# Patient Record
Sex: Female | Born: 1945
Health system: Southern US, Community
[De-identification: ages and names within clinical notes are randomized; demographics above are authoritative.]

## PROBLEM LIST (undated history)

## (undated) DIAGNOSIS — Z5189 Encounter for other specified aftercare: Secondary | ICD-10-CM

## (undated) DIAGNOSIS — K219 Gastro-esophageal reflux disease without esophagitis: Secondary | ICD-10-CM

## (undated) DIAGNOSIS — T7840XA Allergy, unspecified, initial encounter: Secondary | ICD-10-CM

## (undated) DIAGNOSIS — F329 Major depressive disorder, single episode, unspecified: Secondary | ICD-10-CM

## (undated) DIAGNOSIS — M5030 Other cervical disc degeneration, unspecified cervical region: Secondary | ICD-10-CM

## (undated) DIAGNOSIS — D509 Iron deficiency anemia, unspecified: Secondary | ICD-10-CM

## (undated) DIAGNOSIS — F32A Depression, unspecified: Secondary | ICD-10-CM

## (undated) DIAGNOSIS — I10 Essential (primary) hypertension: Secondary | ICD-10-CM

## (undated) DIAGNOSIS — R51 Headache: Secondary | ICD-10-CM

## (undated) DIAGNOSIS — N8111 Cystocele, midline: Secondary | ICD-10-CM

## (undated) DIAGNOSIS — F419 Anxiety disorder, unspecified: Secondary | ICD-10-CM

## (undated) DIAGNOSIS — D649 Anemia, unspecified: Secondary | ICD-10-CM

## (undated) DIAGNOSIS — M19011 Primary osteoarthritis, right shoulder: Secondary | ICD-10-CM

## (undated) HISTORY — DX: Allergy, unspecified, initial encounter: T78.40XA

## (undated) HISTORY — DX: Other cervical disc degeneration, unspecified cervical region: M50.30

## (undated) HISTORY — DX: Gastro-esophageal reflux disease without esophagitis: K21.9

## (undated) HISTORY — PX: EYE SURGERY: SHX253

## (undated) HISTORY — DX: Headache: R51

## (undated) HISTORY — DX: Essential (primary) hypertension: I10

## (undated) HISTORY — PX: TUBAL LIGATION: SHX77

## (undated) HISTORY — DX: Encounter for other specified aftercare: Z51.89

## (undated) HISTORY — PX: COLONOSCOPY: SHX174

## (undated) HISTORY — DX: Depression, unspecified: F32.A

## (undated) HISTORY — DX: Anemia, unspecified: D64.9

## (undated) HISTORY — DX: Primary osteoarthritis, right shoulder: M19.011

## (undated) HISTORY — DX: Major depressive disorder, single episode, unspecified: F32.9

## (undated) HISTORY — DX: Iron deficiency anemia, unspecified: D50.9

## (undated) HISTORY — DX: Cystocele, midline: N81.11

## (undated) HISTORY — DX: Anxiety disorder, unspecified: F41.9

---

## 1998-04-29 ENCOUNTER — Other Ambulatory Visit: Admission: RE | Admit: 1998-04-29 | Discharge: 1998-04-29 | Payer: Self-pay | Admitting: Obstetrics & Gynecology

## 1998-10-24 ENCOUNTER — Other Ambulatory Visit: Admission: RE | Admit: 1998-10-24 | Discharge: 1998-10-24 | Payer: Self-pay | Admitting: Family Medicine

## 1999-09-27 ENCOUNTER — Encounter: Admission: RE | Admit: 1999-09-27 | Discharge: 1999-09-27 | Payer: Self-pay | Admitting: Family Medicine

## 1999-09-27 ENCOUNTER — Encounter: Payer: Self-pay | Admitting: Family Medicine

## 1999-10-16 ENCOUNTER — Other Ambulatory Visit: Admission: RE | Admit: 1999-10-16 | Discharge: 1999-10-16 | Payer: Self-pay | Admitting: Family Medicine

## 2000-09-26 ENCOUNTER — Encounter: Payer: Self-pay | Admitting: Family Medicine

## 2000-09-26 ENCOUNTER — Encounter: Admission: RE | Admit: 2000-09-26 | Discharge: 2000-09-26 | Payer: Self-pay | Admitting: Family Medicine

## 2000-10-17 ENCOUNTER — Other Ambulatory Visit: Admission: RE | Admit: 2000-10-17 | Discharge: 2000-10-17 | Payer: Self-pay | Admitting: Family Medicine

## 2001-09-29 ENCOUNTER — Encounter: Payer: Self-pay | Admitting: Family Medicine

## 2001-09-29 ENCOUNTER — Encounter: Admission: RE | Admit: 2001-09-29 | Discharge: 2001-09-29 | Payer: Self-pay | Admitting: Family Medicine

## 2001-11-05 ENCOUNTER — Other Ambulatory Visit: Admission: RE | Admit: 2001-11-05 | Discharge: 2001-11-05 | Payer: Self-pay | Admitting: Family Medicine

## 2002-11-17 ENCOUNTER — Other Ambulatory Visit: Admission: RE | Admit: 2002-11-17 | Discharge: 2002-11-17 | Payer: Self-pay | Admitting: Family Medicine

## 2002-12-07 ENCOUNTER — Encounter: Admission: RE | Admit: 2002-12-07 | Discharge: 2002-12-07 | Payer: Self-pay | Admitting: Family Medicine

## 2002-12-07 ENCOUNTER — Encounter: Payer: Self-pay | Admitting: Family Medicine

## 2003-06-25 ENCOUNTER — Encounter: Payer: Self-pay | Admitting: Family Medicine

## 2003-06-25 ENCOUNTER — Encounter: Admission: RE | Admit: 2003-06-25 | Discharge: 2003-06-25 | Payer: Self-pay | Admitting: Family Medicine

## 2003-11-22 ENCOUNTER — Other Ambulatory Visit: Admission: RE | Admit: 2003-11-22 | Discharge: 2003-11-22 | Payer: Self-pay | Admitting: Family Medicine

## 2003-12-23 ENCOUNTER — Encounter: Admission: RE | Admit: 2003-12-23 | Discharge: 2003-12-23 | Payer: Self-pay | Admitting: Family Medicine

## 2004-03-01 ENCOUNTER — Emergency Department (HOSPITAL_COMMUNITY): Admission: EM | Admit: 2004-03-01 | Discharge: 2004-03-01 | Payer: Self-pay | Admitting: Emergency Medicine

## 2004-03-08 ENCOUNTER — Encounter: Admission: RE | Admit: 2004-03-08 | Discharge: 2004-03-08 | Payer: Self-pay | Admitting: Family Medicine

## 2004-09-11 ENCOUNTER — Ambulatory Visit: Payer: Self-pay | Admitting: Family Medicine

## 2004-12-28 ENCOUNTER — Encounter: Admission: RE | Admit: 2004-12-28 | Discharge: 2004-12-28 | Payer: Self-pay | Admitting: Family Medicine

## 2004-12-28 ENCOUNTER — Ambulatory Visit: Payer: Self-pay | Admitting: Family Medicine

## 2005-01-03 ENCOUNTER — Other Ambulatory Visit: Admission: RE | Admit: 2005-01-03 | Discharge: 2005-01-03 | Payer: Self-pay | Admitting: Family Medicine

## 2005-01-03 ENCOUNTER — Ambulatory Visit: Payer: Self-pay | Admitting: Family Medicine

## 2005-02-01 ENCOUNTER — Ambulatory Visit: Payer: Self-pay | Admitting: Internal Medicine

## 2005-02-19 ENCOUNTER — Ambulatory Visit: Payer: Self-pay | Admitting: Internal Medicine

## 2005-02-19 ENCOUNTER — Encounter: Payer: Self-pay | Admitting: Family Medicine

## 2005-03-07 ENCOUNTER — Ambulatory Visit: Payer: Self-pay | Admitting: Family Medicine

## 2005-12-31 ENCOUNTER — Encounter: Admission: RE | Admit: 2005-12-31 | Discharge: 2005-12-31 | Payer: Self-pay | Admitting: Family Medicine

## 2006-01-01 ENCOUNTER — Ambulatory Visit: Payer: Self-pay | Admitting: Family Medicine

## 2006-01-04 ENCOUNTER — Ambulatory Visit: Payer: Self-pay | Admitting: Family Medicine

## 2006-01-11 ENCOUNTER — Encounter: Payer: Self-pay | Admitting: Family Medicine

## 2006-01-11 ENCOUNTER — Ambulatory Visit: Payer: Self-pay | Admitting: Family Medicine

## 2006-01-11 ENCOUNTER — Other Ambulatory Visit: Admission: RE | Admit: 2006-01-11 | Discharge: 2006-01-11 | Payer: Self-pay | Admitting: Family Medicine

## 2006-06-19 ENCOUNTER — Ambulatory Visit: Payer: Self-pay | Admitting: Family Medicine

## 2006-06-19 LAB — CONVERTED CEMR LAB
Cholesterol: 222 mg/dL (ref 0–200)
HDL: 49.4 mg/dL (ref >39.0)
LDL DIRECT: 143.8 mg/dL
Triglyceride fasting, serum: 51 mg/dL (ref 0–149)
VLDL: 10 mg/dL (ref 0–40)

## 2006-07-29 ENCOUNTER — Ambulatory Visit: Payer: Self-pay | Admitting: Family Medicine

## 2006-08-07 ENCOUNTER — Ambulatory Visit: Payer: Self-pay | Admitting: Family Medicine

## 2006-10-07 ENCOUNTER — Ambulatory Visit: Payer: Self-pay | Admitting: Family Medicine

## 2007-01-08 ENCOUNTER — Encounter: Admission: RE | Admit: 2007-01-08 | Discharge: 2007-01-08 | Payer: Self-pay | Admitting: Family Medicine

## 2007-02-06 ENCOUNTER — Ambulatory Visit: Payer: Self-pay | Admitting: Family Medicine

## 2007-02-06 LAB — CONVERTED CEMR LAB
ALT: 17 units/L (ref 0–40)
AST: 23 units/L (ref 0–37)
Albumin: 3.9 g/dL (ref 3.5–5.2)
BUN: 12 mg/dL (ref 6–23)
Basophils Absolute: 0 10*3/uL (ref 0.0–0.1)
Bilirubin, Direct: 0.1 mg/dL (ref 0.0–0.3)
CO2: 33 meq/L — ABNORMAL HIGH (ref 19–32)
Cholesterol: 216 mg/dL (ref 0–200)
Creatinine, Ser: 0.7 mg/dL (ref 0.4–1.2)
Direct LDL: 129.3 mg/dL
Eosinophils Absolute: 0.2 10*3/uL (ref 0.0–0.6)
Eosinophils Relative: 3.7 % (ref 0.0–5.0)
GFR calc non Af Amer: 91 mL/min
HDL: 51.9 mg/dL (ref >39.0)
MCV: 89.2 fL (ref 78.0–100.0)
Monocytes Absolute: 0.4 10*3/uL (ref 0.2–0.7)
Monocytes Relative: 9.6 % (ref 3.0–11.0)
Neutro Abs: 1.5 10*3/uL (ref 1.4–7.7)
RDW: 11.4 % — ABNORMAL LOW (ref 11.5–14.6)
Sodium: 142 meq/L (ref 135–145)
Total CHOL/HDL Ratio: 4.2
WBC: 4.4 10*3/uL — ABNORMAL LOW (ref 4.5–10.5)

## 2007-02-13 ENCOUNTER — Encounter: Payer: Self-pay | Admitting: Family Medicine

## 2007-02-13 ENCOUNTER — Other Ambulatory Visit: Admission: RE | Admit: 2007-02-13 | Discharge: 2007-02-13 | Payer: Self-pay | Admitting: Family Medicine

## 2007-02-13 ENCOUNTER — Ambulatory Visit: Payer: Self-pay | Admitting: Family Medicine

## 2007-05-14 ENCOUNTER — Telehealth: Payer: Self-pay | Admitting: Family Medicine

## 2007-08-04 ENCOUNTER — Ambulatory Visit: Payer: Self-pay | Admitting: Family Medicine

## 2007-08-09 DIAGNOSIS — L820 Inflamed seborrheic keratosis: Secondary | ICD-10-CM | POA: Insufficient documentation

## 2008-01-07 ENCOUNTER — Telehealth: Payer: Self-pay | Admitting: Family Medicine

## 2008-01-12 ENCOUNTER — Encounter: Admission: RE | Admit: 2008-01-12 | Discharge: 2008-01-12 | Payer: Self-pay | Admitting: Family Medicine

## 2008-01-27 ENCOUNTER — Ambulatory Visit: Payer: Self-pay | Admitting: Family Medicine

## 2008-01-27 DIAGNOSIS — I10 Essential (primary) hypertension: Secondary | ICD-10-CM

## 2008-01-27 DIAGNOSIS — F329 Major depressive disorder, single episode, unspecified: Secondary | ICD-10-CM

## 2008-02-18 ENCOUNTER — Ambulatory Visit: Payer: Self-pay | Admitting: Family Medicine

## 2008-02-18 LAB — CONVERTED CEMR LAB
ALT: 19 units/L (ref 0–35)
AST: 23 units/L (ref 0–37)
Alkaline Phosphatase: 51 units/L (ref 39–117)
Cholesterol: 190 mg/dL (ref 0–200)
Creatinine, Ser: 0.7 mg/dL (ref 0.4–1.2)
Eosinophils Relative: 2.4 % (ref 0.0–5.0)
GFR calc Af Amer: 109 mL/min
GFR calc non Af Amer: 90 mL/min
Glucose, Bld: 90 mg/dL (ref 70–99)
HCT: 36.6 % (ref 36.0–46.0)
Hemoglobin: 12.6 g/dL (ref 12.0–15.0)
Lymphocytes Relative: 36 % (ref 12.0–46.0)
MCHC: 34.4 g/dL (ref 30.0–36.0)
MCV: 90.9 fL (ref 78.0–100.0)
Monocytes Relative: 7.6 % (ref 3.0–12.0)
Neutrophils Relative %: 53.4 % (ref 43.0–77.0)
Platelets: 299 10*3/uL (ref 150–400)
RBC: 4.02 M/uL (ref 3.87–5.11)
RDW: 12.8 % (ref 11.5–14.6)
Total Bilirubin: 0.7 mg/dL (ref 0.3–1.2)
Total CHOL/HDL Ratio: 3.6
Total Protein: 6.6 g/dL (ref 6.0–8.3)
VLDL: 12 mg/dL (ref 0–40)

## 2008-02-27 ENCOUNTER — Encounter: Payer: Self-pay | Admitting: Family Medicine

## 2008-02-27 ENCOUNTER — Ambulatory Visit: Payer: Self-pay | Admitting: Family Medicine

## 2008-02-27 ENCOUNTER — Other Ambulatory Visit: Admission: RE | Admit: 2008-02-27 | Discharge: 2008-02-27 | Payer: Self-pay | Admitting: Family Medicine

## 2008-02-27 LAB — CONVERTED CEMR LAB: Helicobacter Pylori Antibody-IgG: 0.6

## 2008-03-01 ENCOUNTER — Telehealth: Payer: Self-pay | Admitting: Family Medicine

## 2008-03-15 ENCOUNTER — Encounter: Payer: Self-pay | Admitting: Family Medicine

## 2008-03-29 ENCOUNTER — Ambulatory Visit: Payer: Self-pay | Admitting: Family Medicine

## 2008-04-14 ENCOUNTER — Telehealth: Payer: Self-pay | Admitting: Family Medicine

## 2008-09-10 ENCOUNTER — Ambulatory Visit: Payer: Self-pay | Admitting: Family Medicine

## 2008-09-10 DIAGNOSIS — N329 Bladder disorder, unspecified: Secondary | ICD-10-CM | POA: Insufficient documentation

## 2008-09-13 ENCOUNTER — Telehealth: Payer: Self-pay | Admitting: Family Medicine

## 2008-11-15 ENCOUNTER — Telehealth: Payer: Self-pay | Admitting: *Deleted

## 2009-01-26 ENCOUNTER — Encounter: Admission: RE | Admit: 2009-01-26 | Discharge: 2009-01-26 | Payer: Self-pay | Admitting: Family Medicine

## 2009-04-01 ENCOUNTER — Ambulatory Visit: Payer: Self-pay | Admitting: Family Medicine

## 2009-04-01 LAB — CONVERTED CEMR LAB
Alkaline Phosphatase: 46 units/L (ref 39–117)
Basophils Relative: 1.3 % (ref 0.0–3.0)
CO2: 30 meq/L (ref 19–32)
Calcium: 9.1 mg/dL (ref 8.4–10.5)
Cholesterol: 187 mg/dL (ref 0–200)
Eosinophils Absolute: 0.1 10*3/uL (ref 0.0–0.7)
Glucose, Urine, Semiquant: NEGATIVE
Ketones, urine, test strip: NEGATIVE
MCV: 91 fL (ref 78.0–100.0)
Monocytes Absolute: 0.4 10*3/uL (ref 0.1–1.0)
Monocytes Relative: 9 % (ref 3.0–12.0)
Neutro Abs: 2 10*3/uL (ref 1.4–7.7)
Neutrophils Relative %: 47.2 % (ref 43.0–77.0)
Nitrite: NEGATIVE
Potassium: 3.9 meq/L (ref 3.5–5.1)
RDW: 12.4 % (ref 11.5–14.6)
Sodium: 144 meq/L (ref 135–145)
Specific Gravity, Urine: 1.015
Total CHOL/HDL Ratio: 4
Total Protein: 6.5 g/dL (ref 6.0–8.3)
Triglycerides: 71 mg/dL (ref 0.0–149.0)
VLDL: 14.2 mg/dL (ref 0.0–40.0)
WBC: 4.3 10*3/uL — ABNORMAL LOW (ref 4.5–10.5)
pH: 7.5

## 2009-04-11 ENCOUNTER — Other Ambulatory Visit: Admission: RE | Admit: 2009-04-11 | Discharge: 2009-04-11 | Payer: Self-pay | Admitting: Family Medicine

## 2009-04-11 ENCOUNTER — Encounter: Payer: Self-pay | Admitting: Family Medicine

## 2009-04-11 ENCOUNTER — Ambulatory Visit: Payer: Self-pay | Admitting: Family Medicine

## 2009-09-07 ENCOUNTER — Telehealth: Payer: Self-pay | Admitting: Family Medicine

## 2009-09-15 ENCOUNTER — Encounter (INDEPENDENT_AMBULATORY_CARE_PROVIDER_SITE_OTHER): Payer: Self-pay | Admitting: *Deleted

## 2009-10-14 ENCOUNTER — Telehealth: Payer: Self-pay | Admitting: Family Medicine

## 2010-02-08 ENCOUNTER — Encounter: Admission: RE | Admit: 2010-02-08 | Discharge: 2010-02-08 | Payer: Self-pay | Admitting: Family Medicine

## 2010-02-20 ENCOUNTER — Ambulatory Visit: Payer: Self-pay | Admitting: Family Medicine

## 2010-02-20 LAB — CONVERTED CEMR LAB
AST: 27 units/L (ref 0–37)
Alkaline Phosphatase: 57 units/L (ref 39–117)
BUN: 18 mg/dL (ref 6–23)
Basophils Absolute: 0 10*3/uL (ref 0.0–0.1)
Bilirubin, Direct: 0.1 mg/dL (ref 0.0–0.3)
CO2: 30 meq/L (ref 19–32)
Calcium: 9.3 mg/dL (ref 8.4–10.5)
Chloride: 104 meq/L (ref 96–112)
Eosinophils Relative: 2.2 % (ref 0.0–5.0)
Glucose, Bld: 90 mg/dL (ref 70–99)
Iron: 55 ug/dL (ref 42–145)
Lymphocytes Relative: 39.7 % (ref 12.0–46.0)
Lymphs Abs: 2.3 10*3/uL (ref 0.7–4.0)
MCHC: 34.4 g/dL (ref 30.0–36.0)
Neutro Abs: 2.9 10*3/uL (ref 1.4–7.7)
Potassium: 4 meq/L (ref 3.5–5.1)
RBC: 3.95 M/uL (ref 3.87–5.11)
TSH: 1.66 microintl units/mL (ref 0.35–5.50)
Total Bilirubin: 0.3 mg/dL (ref 0.3–1.2)
Total Protein: 6.9 g/dL (ref 6.0–8.3)
Vitamin B-12: 537 pg/mL (ref 211–911)
WBC: 5.9 10*3/uL (ref 4.5–10.5)

## 2010-06-05 ENCOUNTER — Telehealth: Payer: Self-pay | Admitting: Family Medicine

## 2010-06-05 ENCOUNTER — Ambulatory Visit: Payer: Self-pay | Admitting: Family Medicine

## 2010-06-05 LAB — CONVERTED CEMR LAB
Albumin: 4.2 g/dL (ref 3.5–5.2)
Bilirubin Urine: NEGATIVE
CO2: 30 meq/L (ref 19–32)
Calcium: 9 mg/dL (ref 8.4–10.5)
Creatinine, Ser: 0.7 mg/dL (ref 0.4–1.2)
Direct LDL: 169.7 mg/dL
Eosinophils Relative: 2 % (ref 0.0–5.0)
HDL: 59.3 mg/dL (ref >39.00)
Hemoglobin: 12.8 g/dL (ref 12.0–15.0)
Ketones, urine, test strip: NEGATIVE
MCHC: 34.4 g/dL (ref 30.0–36.0)
Monocytes Relative: 8.3 % (ref 3.0–12.0)
Neutro Abs: 2.3 10*3/uL (ref 1.4–7.7)
Nitrite: NEGATIVE
Potassium: 3.3 meq/L — ABNORMAL LOW (ref 3.5–5.1)
Protein, U semiquant: NEGATIVE
RBC: 4.02 M/uL (ref 3.87–5.11)
RDW: 12.8 % (ref 11.5–14.6)
TSH: 1.69 microintl units/mL (ref 0.35–5.50)
Triglycerides: 93 mg/dL (ref 0.0–149.0)
Urobilinogen, UA: 0.2

## 2010-06-12 ENCOUNTER — Encounter: Payer: Self-pay | Admitting: Family Medicine

## 2010-06-12 ENCOUNTER — Other Ambulatory Visit: Admission: RE | Admit: 2010-06-12 | Discharge: 2010-06-12 | Payer: Self-pay | Admitting: Family Medicine

## 2010-06-12 ENCOUNTER — Ambulatory Visit: Payer: Self-pay | Admitting: Family Medicine

## 2010-06-12 LAB — CONVERTED CEMR LAB: Pap Smear: NEGATIVE

## 2010-10-05 NOTE — Assessment & Plan Note (Signed)
Summary: FATIGUE // RS   Vital Signs:  Patient profile:   65 year old female Menstrual status:  postmenopausal Weight:      131 pounds BMI:     25.25 Temp:     97.9 degrees F oral BP sitting:   130 / 84  (left arm) Cuff size:   regular  Vitals Entered By: Kern Reap CMA Duncan Dull) (February 20, 2010 3:55 PM)  CC: no energy in legs   CC:  no energy in legs.  History of Present Illness: Sally Shea is a 65 year old, married female, nonsmoker, who comes in today for evaluation of generalized fatigue, and bilateral lower extremity weakness.  Two weeks ago she began feeling this way.  She's otherwise been well.  No fever, chills, earache, sore throat, cough, nausea, vomiting, etc., etc., etc.  Medications unchanged.  He takes Lipitor thiazide 25 mg daily for swollen legs along with a potassium supplement.  She alternates between 50 and 25 mg of Zoloft daily.  She takes Zomig p.r.n. for migraines, but has not had to take them recently.  No new medication.  Review of systems otherwise negative.  Social history unchanged  Allergies: 1)  Sulfamethoxazole (Sulfamethoxazole)  Past History:  Past medical, surgical, family and social histories (including risk factors) reviewed, and no changes noted (except as noted below).  Past Medical History: Reviewed history from 01/27/2008 and no changes required. Anxiety Depression Headache Hyperlipidemia Hypertension childbirth x 2 Lasix eye surgery cervical degenerative disease  Family History: Reviewed history from 01/27/2008 and no changes required. Family History of Alcoholism/Addiction Family History of Anxiety Family History Depression Family History High cholesterol Family History Hypertension  Social History: Reviewed history from 01/27/2008 and no changes required. Occupation: Married Never Smoked Alcohol use-no Drug use-no Regular exercise-yes  Review of Systems      See HPI  Physical Exam  General:   Well-developed,well-nourished,in no acute distress; alert,appropriate and cooperative throughout examination Msk:  No deformity or scoliosis noted of thoracic or lumbar spine.   Pulses:  R and L carotid,radial,femoral,dorsalis pedis and posterior tibial pulses are full and equal bilaterally Extremities:  No clubbing, cyanosis, edema, or deformity noted with normal full range of motion of all joints.   Neurologic:  No cranial nerve deficits noted. Station and gait are normal. Plantar reflexes are down-going bilaterally. DTRs are symmetrical throughout. Sensory, motor and coordinative functions appear intact. Psych:  Cognition and judgment appear intact. Alert and cooperative with normal attention span and concentration. No apparent delusions, illusions, hallucinations   Problems:  Medical Problems Added: 1)  Dx of Fatigue  (ICD-780.79)  Impression & Recommendations:  Problem # 1:  FATIGUE (ICD-780.79) Assessment New  Orders: Venipuncture ([address]) TLB-BMP (Basic Metabolic Panel-BMET) (80048-METABOL) TLB-CBC Platelet - w/Differential (85025-CBCD) TLB-Hepatic/Liver Function Pnl (80076-HEPATIC) TLB-TSH (Thyroid Stimulating Hormone) (84443-TSH) TLB-B12 + Folate Pnl (60454_09811-B14/NWG) TLB-IBC Pnl (Iron/FE;Transferrin) (83550-IBC) TLB-T3, Free (Triiodothyronine) (84481-T3FREE) TLB-T4 (Thyrox), Free [phone]) TLB-Sedimentation Rate (ESR) (85652-ESR)  Complete Medication List: 1)  Hydrochlorothiazide 25 Mg Tabs (Hydrochlorothiazide) .Marland Kitchen.. 1 tablet by mouth every morning 2)  Zetia 10 Mg Tabs (Ezetimibe) .... Take 1 tablet by mouth once a day 3)  Zoloft 50 Mg Tabs (Sertraline hcl) .... Take 1 tablet by mouth once a day 4)  Zomig Zmt 5 Mg Tbdp (Zolmitriptan) .... Take 1 tablet by mouth 5)  Adult Aspirin Low Strength 81 Mg Tbdp (Aspirin) .... Once daily 6)  Calcium 600 1500 Mg Tabs (Calcium carbonate) .... Once daily 7)  Omega-3 350 Mg Caps (Omega-3 fatty acids) .Marland KitchenMarland KitchenMarland Kitchen  Once daily 8)   Klor-con M20 20 Meq Cr-tabs (Potassium chloride crys cr) .... One by mouth daily 9)  Premarin 0.625 Mg/gm Crea (Estrogens, conjugated) .... Apply weekly  Patient Instructions: 1)  increase the Zoloft to 50 mg daily.  I will call u when I get your lab work back.

## 2010-10-05 NOTE — Miscellaneous (Signed)
  Clinical Lists Changes  Observations: Added new observation of FLU VAX VIS: 04/12/09 version (09/15/2009 12:33) Added new observation of FLU VAXLOT: [id number] (09/15/2009 12:33) Added new observation of FLU VAXMFR: Glaxosmithkline (09/15/2009 12:33) Added new observation of FLU VAX EXP: 03/02/2010 (09/15/2009 12:33) Added new observation of FLU VAX DSE: 0.22ml (09/15/2009 12:33) Added new observation of FLU VAX: Fluvax 3+ (09/15/2009 12:33)Flu Vaccine Consent Questions     Do you have a history of severe allergic reactions to this vaccine? no    Any prior history of allergic reactions to egg and/or gelatin? no    Do you have a sensitivity to the preservative Thimersol? no    Do you have a past history of Guillan-Barre Syndrome? no    Do you currently have an acute febrile illness? no    Have you ever had a severe reaction to latex? no    Vaccine information given and explained to patient? yes    Are you currently pregnant? no    Lot Number:[id number]   Exp Date:03/02/2010   Site Given  Right Deltoid IMvation of FLU VAXMFR: Glaxosmithkline (09/15/2009 12:33) Added new observation of FLU VAX EXP: 03/02/2010 (09/15/2009 12:33) Added new observation of FLU VAX DSE: 0.6ml (09/15/2009 12:33) Added new observation of FLU VAX: Fluvax 3+ (09/15/2009 12:33)    .lbflu

## 2010-10-05 NOTE — Assessment & Plan Note (Signed)
Summary: CPX // RS   Vital Signs:  Patient profile:   65 year old female Menstrual status:  postmenopausal Height:      60.5 inches Weight:      129 pounds BMI:     24.87 O2 Sat:      94 % Temp:     98.2 degrees F oral Pulse rate:   93 / minute Resp:     12 per minute BP sitting:   126 / 74  Vitals Entered By: Lynann Beaver CMA (June 12, 2010 2:46 PM) CC: cpx Is Patient Diabetic? No Pain Assessment Patient in pain? no        CC:  cpx.  History of Present Illness: Sally Shea is a 65 year old, married female, nonsmoker, who comes in today for evaluation of hypertension, hyperlipidemia, mild depression postmenopausal vaginal dryness.  For hypertension.  She takes hydrochlorothiazide 25 mg q.a.m., BP 124/74.  For mild depression.  She takes Zoloft 50 mg nightly feels good.  She also uses Premarin vaginal cream once weekly for vaginal dryness and one, potassium supplement, and aspirin tablet, calcium, vitamin D.  She stopped her CEA because she just didn't want to take anymore.  Lipids have gone up fairly dramatically, however, her cardiac risk factor is a 4.  She gets routine eye care, dental care, BSE monthly, annual mammography, colonoscopy, normal, tetanus, 2003, seasonal flu.  2011  She is not had to take a Zomig.  This year.  The migraine headaches have virtually stopped.  Current Medications (verified): 1)  Hydrochlorothiazide 25 Mg Tabs (Hydrochlorothiazide) .Marland Kitchen.. 1 Tablet By Mouth Every Morning 2)  Zoloft 50 Mg Tabs (Sertraline Hcl) .... Take 1 Tablet By Mouth Once A Day 3)  Zomig Zmt 5 Mg Tbdp (Zolmitriptan) .... Take 1 Tablet By Mouth 4)  Adult Aspirin Low Strength 81 Mg  Tbdp (Aspirin) .... Once Daily 5)  Calcium 600 1500 Mg  Tabs (Calcium Carbonate) .... Once Daily 6)  Omega-3 350 Mg  Caps (Omega-3 Fatty Acids) .... Once Daily 7)  Klor-Con M20 20 Meq  Cr-Tabs (Potassium Chloride Crys Cr) .... One By Mouth Daily 8)  Premarin 0.625 Mg/gm Crea (Estrogens, Conjugated)  .... Apply Weekly  Allergies (verified): 1)  Sulfamethoxazole (Sulfamethoxazole)  Past History:  Past medical, surgical, family and social histories (including risk factors) reviewed, and no changes noted (except as noted below).  Past Medical History: Reviewed history from 01/27/2008 and no changes required. Anxiety Depression Headache Hyperlipidemia Hypertension childbirth x 2 Lasix eye surgery cervical degenerative disease  Family History: Reviewed history from 01/27/2008 and no changes required. Family History of Alcoholism/Addiction Family History of Anxiety Family History Depression Family History High cholesterol Family History Hypertension  Social History: Reviewed history from 01/27/2008 and no changes required. Occupation: Married Never Smoked Alcohol use-no Drug use-no Regular exercise-yes  Review of Systems      See HPI  Physical Exam  General:  Well-developed,well-nourished,in no acute distress; alert,appropriate and cooperative throughout examination Head:  Normocephalic and atraumatic without obvious abnormalities. No apparent alopecia or balding. Eyes:  No corneal or conjunctival inflammation noted. EOMI. Perrla. Funduscopic exam benign, without hemorrhages, exudates or papilledema. Vision grossly normal. Ears:  External ear exam shows no significant lesions or deformities.  Otoscopic examination reveals clear canals, tympanic membranes are intact bilaterally without bulging, retraction, inflammation or discharge. Hearing is grossly normal bilaterally. Nose:  External nasal examination shows no deformity or inflammation. Nasal mucosa are pink and moist without lesions or exudates. Mouth:  Oral mucosa and oropharynx  without lesions or exudates.  Teeth in good repair. Neck:  No deformities, masses, or tenderness noted. Chest Wall:  No deformities, masses, or tenderness noted. Breasts:  No mass, nodules, thickening, tenderness, bulging, retraction,  inflamation, nipple discharge or skin changes noted.   Lungs:  Normal respiratory effort, chest expands symmetrically. Lungs are clear to auscultation, no crackles or wheezes. Heart:  Normal rate and regular rhythm. S1 and S2 normal without gallop, murmur, click, rub or other extra sounds. Abdomen:  Bowel sounds positive,abdomen soft and non-tender without masses, organomegaly or hernias noted. Rectal:  No external abnormalities noted. Normal sphincter tone. No rectal masses or tenderness. Genitalia:  Pelvic Exam:        External: normal female genitalia without lesions or masses        Vagina: normal without lesions or masses        Cervix: normal without lesions or masses        Adnexa: normal bimanual exam without masses or fullness        Uterus: normal by palpation        Pap smear: performed Msk:  No deformity or scoliosis noted of thoracic or lumbar spine.   Pulses:  R and L carotid,radial,femoral,dorsalis pedis and posterior tibial pulses are full and equal bilaterally Extremities:  No clubbing, cyanosis, edema, or deformity noted with normal full range of motion of all joints.   Neurologic:  No cranial nerve deficits noted. Station and gait are normal. Plantar reflexes are down-going bilaterally. DTRs are symmetrical throughout. Sensory, motor and coordinative functions appear intact. Skin:  Intact without suspicious lesions or rashes Cervical Nodes:  No lymphadenopathy noted Axillary Nodes:  No palpable lymphadenopathy Inguinal Nodes:  No significant adenopathy Psych:  Cognition and judgment appear intact. Alert and cooperative with normal attention span and concentration. No apparent delusions, illusions, hallucinations   Impression & Recommendations:  Problem # 1:  HYPERTENSION (ICD-401.9) Assessment Improved  Her updated medication list for this problem includes:    Hydrochlorothiazide 25 Mg Tabs (Hydrochlorothiazide) .Marland Kitchen... 1 tablet by mouth every morning  Orders: EKG w/  Interpretation (93000) Prescription Created Electronically [phone])  Problem # 2:  HEADACHE (ICD-784.0) Assessment: Improved  Her updated medication list for this problem includes:    Zomig Zmt 5 Mg Tbdp (Zolmitriptan) .Marland Kitchen... Take 1 tablet by mouth    Adult Aspirin Low Strength 81 Mg Tbdp (Aspirin) ..... Once daily  Orders: Prescription Created Electronically [phone])  Problem # 3:  HYPERLIPIDEMIA (ICD-272.4) Assessment: Deteriorated  The following medications were removed from the medication list:    Zetia 10 Mg Tabs (Ezetimibe) .Marland Kitchen... Take 1 tablet by mouth once a day  Orders: EKG w/ Interpretation (93000) Prescription Created Electronically [phone])  Problem # 4:  Preventive Health Care (ICD-V70.0) Assessment: Unchanged  Complete Medication List: 1)  Hydrochlorothiazide 25 Mg Tabs (Hydrochlorothiazide) .Marland Kitchen.. 1 tablet by mouth every morning 2)  Zoloft 50 Mg Tabs (Sertraline hcl) .... Take 1 tablet by mouth once a day 3)  Zomig Zmt 5 Mg Tbdp (Zolmitriptan) .... Take 1 tablet by mouth 4)  Adult Aspirin Low Strength 81 Mg Tbdp (Aspirin) .... Once daily 5)  Calcium 600 1500 Mg Tabs (Calcium carbonate) .... Once daily 6)  Omega-3 350 Mg Caps (Omega-3 fatty acids) .... Once daily 7)  Klor-con M20 20 Meq Cr-tabs (Potassium chloride crys cr) .... One by mouth daily 8)  Premarin 0.625 Mg/gm Crea (Estrogens, conjugated) .... Apply weekly  Other Orders: Admin 1st Vaccine ([address]) Flu Vaccine 7yrs + [phone])  Flu Vaccine Consent Questions     Do you have a history of severe allergic reactions to this vaccine? no    Any prior history of allergic reactions to egg and/or gelatin? no    Do you have a sensitivity to the preservative Thimersol? no    Do you have a past history of Guillan-Barre Syndrome? no    Do you currently have an acute febrile illness? no    Have you ever had a severe reaction to latex? no    Vaccine information given and explained to patient? yes    Are you currently  pregnant? no    Lot Number:[id number]   Exp Date:03/03/2011   Site Given  Left Deltoid IM Admin 1st Vaccine ([address]109) Flu Vaccine 74yrs + (60454)  Patient Instructions: 1)  It is important that you exercise regularly at least 20 minutes 5 times a week. If you develop chest pain, have severe difficulty breathing, or feel very tired , stop exercising immediately and seek medical attention. 2)  Schedule your mammogram. 3)  Schedule a colonoscopy/sigmoidoscopy to help detect colon cancer. 4)  If you are having sex and you or your partner don't want a child, use contraception. 5)  Take an Aspirin every day.  81 mg 3 x week 6)  use the Premarin vaginal cream, small amounts twice weekly Prescriptions: PREMARIN 0.625 MG/GM CREA (ESTROGENS, CONJUGATED) apply weekly  #3 tubes x 4   Entered and Authorized by:   Roderick Pee MD   Signed by:   Roderick Pee MD on 06/12/2010   Method used:   Electronically to        [facility] [address] [phone]* (retail)       [address]       [place], [place]  [address]       Ph: [phone] or [phone]       Fax: [phone]   RxID:   [id number] KLOR-CON M20 20 MEQ  CR-TABS (POTASSIUM CHLORIDE CRYS CR) one by mouth daily  #100 Tablet x 3   Entered and Authorized by:   Roderick Pee MD   Signed by:   Roderick Pee MD on 06/12/2010   Method used:   [facility]ronically to        [facility] [address] [phone]* (retail)       [address]       [place], [place]  [address]       Ph: [phone] or [phone]       Fax: [phone]   RxID:   [id number] ZOLOFT 50 MG TABS (SERTRALINE HCL) Take 1 tablet by mouth once a day  #100 Tablet x 3   Entered and Authorized by:   Roderick Pee MD   Signed by:   Roderick Pee MD on 06/12/2010   Method used:   [facility]        [facility] [phone]* (retail)       [address]       [place], [place]  [address]       Ph: [phone] or [phone]       Fax: [phone]   RxID:    [id number] HYDROCHLOROTHIAZIDE 25 MG TABS (HYDROCHLOROTHIAZIDE) 1 tablet by mouth every morning  #100 Tablet x 3   Entered and Authorized by:   Roderick Pee MD   Signed by:   Roderick Pee MD on 06/12/2010   Method used:   [facility]ronically  to        [facility] [phone]* (retail)       [address]       [place], [place]  [address]       Ph: [phone] or [phone]       Fax: [phone]   RxID:   [id number]  .lbflu1

## 2010-10-05 NOTE — Progress Notes (Signed)
Summary: refill  Phone Note Refill Request Call back at Home Phone [phone] Message from:  Patient---walk in  Refills Requested: Medication #1:  KLOR-CON M20 20 MEQ  CR-TABS one by mouth daily [facility]  Initial call taken by: Warnell Forester,  June 05, 2010 9:42 AM    Prescriptions: KLOR-CON M20 20 MEQ  CR-TABS (POTASSIUM CHLORIDE CRYS CR) one by mouth daily  #100 Tablet x 3   Entered by:   Kern Reap CMA (AAMA)   Authorized by:   Roderick Pee MD   Signed by:   Kern Reap CMA (AAMA) on 06/05/2010   Method used:   Electronically to        [facility] [phone]* (retail)       [address]       [place], [place]  [address]       Ph: [phone] or [phone]       Fax: [phone]   RxID:   [id number]

## 2010-10-05 NOTE — Progress Notes (Signed)
Summary: hydromet rx  Phone Note Call from Patient   Summary of Call: rx for cough  Follow-up for Phone Call        Rx Called In Follow-up by: Kern Reap CMA Duncan Dull),  October 14, 2009 8:40 AM

## 2010-10-05 NOTE — Progress Notes (Signed)
Summary: zoloft refill  Phone Note Refill Request Message from:  Pharmacy on September 07, 2009 9:54 AM  Refills Requested: Medication #1:  ZOLOFT 50 MG TABS Take 1 tablet by mouth once a day  Method Requested: Electronic Initial call taken by: Kern Reap CMA Duncan Dull),  September 07, 2009 9:54 AM    Prescriptions: ZOLOFT 50 MG TABS (SERTRALINE HCL) Take 1 tablet by mouth once a day  #100 Tablet x 3   Entered by:   Kern Reap CMA (AAMA)   Authorized by:   Roderick Pee MD   Signed by:   Kern Reap CMA (AAMA) on 09/07/2009   Method used:   [facility]        [facility] [address] [phone]* (retail)       [address]       [place], [place]  [address]       Ph: [phone] or [phone]       Fax: [phone]   RxID:   [id number]

## 2011-04-02 ENCOUNTER — Other Ambulatory Visit: Payer: Self-pay | Admitting: Family Medicine

## 2011-04-02 DIAGNOSIS — Z1231 Encounter for screening mammogram for malignant neoplasm of breast: Secondary | ICD-10-CM

## 2011-04-06 ENCOUNTER — Ambulatory Visit
Admission: RE | Admit: 2011-04-06 | Discharge: 2011-04-06 | Disposition: A | Discharge status: Home / Self Care | Payer: BC Managed Care – PPO | Source: Ambulatory Visit | Attending: Family Medicine | Admitting: Family Medicine

## 2011-04-06 DIAGNOSIS — Z1231 Encounter for screening mammogram for malignant neoplasm of breast: Secondary | ICD-10-CM

## 2011-06-13 ENCOUNTER — Other Ambulatory Visit (INDEPENDENT_AMBULATORY_CARE_PROVIDER_SITE_OTHER): Payer: BC Managed Care – PPO

## 2011-06-13 DIAGNOSIS — Z0000 Encounter for general adult medical examination without abnormal findings: Secondary | ICD-10-CM

## 2011-06-13 LAB — CBC WITH DIFFERENTIAL/PLATELET
Basophils Relative: 0.8 % (ref 0.0–3.0)
HCT: 38.6 % (ref 36.0–46.0)
MCHC: 33.4 g/dL (ref 30.0–36.0)
Monocytes Absolute: 0.4 10*3/uL (ref 0.1–1.0)
Neutrophils Relative %: 44.9 % (ref 43.0–77.0)
RBC: 4.21 Mil/uL (ref 3.87–5.11)

## 2011-06-13 LAB — TSH: TSH: 1.37 u[IU]/mL (ref 0.35–5.50)

## 2011-06-13 LAB — POCT URINALYSIS DIPSTICK
Bilirubin, UA: NEGATIVE
Glucose, UA: NEGATIVE
Ketones, UA: NEGATIVE
Nitrite, UA: NEGATIVE
pH, UA: 7.5

## 2011-06-13 LAB — BASIC METABOLIC PANEL
BUN: 13 mg/dL (ref 6–23)
CO2: 30 mEq/L (ref 19–32)
Calcium: 9.1 mg/dL (ref 8.4–10.5)
Chloride: 103 mEq/L (ref 96–112)
Creatinine, Ser: 0.7 mg/dL (ref 0.4–1.2)
Glucose, Bld: 81 mg/dL (ref 70–99)
Potassium: 3.7 mEq/L (ref 3.5–5.1)

## 2011-06-13 LAB — LIPID PANEL
Total CHOL/HDL Ratio: 5
VLDL: 25.4 mg/dL (ref 0.0–40.0)

## 2011-06-13 LAB — LDL CHOLESTEROL, DIRECT: Direct LDL: 176 mg/dL

## 2011-06-13 LAB — HEPATIC FUNCTION PANEL
Bilirubin, Direct: 0 mg/dL (ref 0.0–0.3)
Total Bilirubin: 0.6 mg/dL (ref 0.3–1.2)

## 2011-06-20 ENCOUNTER — Other Ambulatory Visit (HOSPITAL_COMMUNITY)
Admission: RE | Admit: 2011-06-20 | Discharge: 2011-06-20 | Disposition: A | Discharge status: Home / Self Care | Payer: BC Managed Care – PPO | Source: Ambulatory Visit | Attending: Family Medicine | Admitting: Family Medicine

## 2011-06-20 ENCOUNTER — Encounter: Payer: Self-pay | Admitting: Family Medicine

## 2011-06-20 ENCOUNTER — Ambulatory Visit (INDEPENDENT_AMBULATORY_CARE_PROVIDER_SITE_OTHER): Payer: BC Managed Care – PPO | Admitting: Family Medicine

## 2011-06-20 DIAGNOSIS — N952 Postmenopausal atrophic vaginitis: Secondary | ICD-10-CM

## 2011-06-20 DIAGNOSIS — F329 Major depressive disorder, single episode, unspecified: Secondary | ICD-10-CM

## 2011-06-20 DIAGNOSIS — Z01419 Encounter for gynecological examination (general) (routine) without abnormal findings: Secondary | ICD-10-CM

## 2011-06-20 DIAGNOSIS — I10 Essential (primary) hypertension: Secondary | ICD-10-CM

## 2011-06-20 DIAGNOSIS — Z23 Encounter for immunization: Secondary | ICD-10-CM

## 2011-06-20 MED ORDER — ESTROGENS, CONJUGATED 0.625 MG/GM VA CREA: 1.0000 g | TOPICAL_CREAM | Freq: Every day | VAGINAL | Status: DC

## 2011-06-20 MED ORDER — SERTRALINE HCL 50 MG PO TABS: 50.0000 mg | ORAL_TABLET | Freq: Every day | ORAL | Status: DC

## 2011-06-20 MED ORDER — POTASSIUM CHLORIDE CRYS ER 20 MEQ PO TBCR: 20.0000 meq | EXTENDED_RELEASE_TABLET | Freq: Every day | ORAL | Status: DC

## 2011-06-20 MED ORDER — HYDROCHLOROTHIAZIDE 25 MG PO TABS: 25.0000 mg | ORAL_TABLET | Freq: Every day | ORAL | Status: DC

## 2011-06-20 NOTE — Progress Notes (Signed)
  Subjective:    Patient ID: Sally Shea, female    DOB: [birth year], 65 y.o.   MRN: [id number]  Hypertension   Sally Shea is a 65 year old, married female, nonsmoker, who comes in today for general physical examination because of a history of hypertension, mild depression, migraine headaches, and postmenopausal vaginal dryness.  Her hypertension is treated with Mevacor, thiazide, and one potassium supplement daily.  BP 124/78.  She uses Premarin vaginal cream twice weekly for vaginal dryness.  She takes Zoloft 50 mg nightly for history of mild depression,  She's not had a migraine headache in over a year.  She does routine eye care, dental care, BSE monthly, and mammography, colonoscopy, normal, tetanus, 2003, shingles 2009, seasonal flu shot today   Review of Systems  Constitutional: Negative.   HENT: Negative.   Eyes: Negative.   Respiratory: Negative.   Cardiovascular: Negative.   Gastrointestinal: Negative.   Genitourinary: Negative.   Musculoskeletal: Negative.   Neurological: Negative.   Hematological: Negative.   Psychiatric/Behavioral: Negative.        Objective:   Physical Exam  Constitutional: She appears well-developed and well-nourished.  HENT:  Head: Normocephalic and atraumatic.  Right Ear: External ear normal.  Left Ear: External ear normal.  Nose: Nose normal.  Mouth/Throat: Oropharynx is clear and moist.  Eyes: EOM are normal. Pupils are equal, round, and reactive to light.  Neck: Normal range of motion. Neck supple. No thyromegaly present.  Cardiovascular: Normal rate, regular rhythm, normal heart sounds and intact distal pulses.  Exam reveals no gallop and no friction rub.   No murmur heard. Pulmonary/Chest: Effort normal and breath sounds normal.  Abdominal: Soft. Bowel sounds are normal. She exhibits no distension and no mass. There is no tenderness. There is no rebound.  Genitourinary: Vagina normal and uterus normal. Guaiac negative stool. No vaginal  discharge found.       Bilateral breast exam normal  Musculoskeletal: Normal range of motion.  Lymphadenopathy:    She has no cervical adenopathy.  Neurological: She is alert. She has normal reflexes. No cranial nerve deficit. She exhibits normal muscle tone. Coordination normal.  Skin: Skin is warm and dry.  Psychiatric: She has a normal mood and affect. Her behavior is normal. Judgment and thought content normal.          Assessment & Plan:  Healthy female.  Postmenopausal vaginal dryness.  Continue Premarin cream.  Hypertension.  Continue her chlorothiazide, and one potassium supplement.  Migraine headaches, resolved.  Return in one year, sooner for any problems

## 2011-06-20 NOTE — Patient Instructions (Signed)
Continue your current good health habits.  Return in one year or sooner if any problems

## 2011-06-28 ENCOUNTER — Other Ambulatory Visit: Payer: Self-pay | Admitting: Family Medicine

## 2011-06-28 DIAGNOSIS — R29890 Loss of height: Secondary | ICD-10-CM

## 2011-06-28 DIAGNOSIS — Z780 Asymptomatic menopausal state: Secondary | ICD-10-CM

## 2011-07-06 ENCOUNTER — Other Ambulatory Visit: Payer: Self-pay | Admitting: Family Medicine

## 2012-05-22 ENCOUNTER — Encounter: Payer: Self-pay | Admitting: Family Medicine

## 2012-05-22 ENCOUNTER — Ambulatory Visit (INDEPENDENT_AMBULATORY_CARE_PROVIDER_SITE_OTHER): Payer: BC Managed Care – PPO | Admitting: Family Medicine

## 2012-05-22 VITALS — BP 120/80 | Temp 99.0°F

## 2012-05-22 DIAGNOSIS — J301 Allergic rhinitis due to pollen: Secondary | ICD-10-CM

## 2012-05-22 DIAGNOSIS — Z23 Encounter for immunization: Secondary | ICD-10-CM

## 2012-05-22 NOTE — Progress Notes (Signed)
  Subjective:    Patient ID: Sally Shea, female    DOB: Jan 09, 1946, 66 y.o.   MRN: [id number]  HPI Sally Shea is a 66 year old married female nonsmoker who comes in today for evaluation of head congestion postnasal drip sneezing coughing times one day  She has a history of allergic rhinitis and does a lot of cleaning last week and was exposed to a lot of dust.,,,,,,,,,,, has trouble in the fall   Review of Systems    general and ENT review of systems otherwise negative no history of asthma Objective:   Physical Exam Well-developed well-nourished female no acute distress HEENT negative neck was supple no adenopathy lungs are clear       Assessment & Plan:  Allergic rhinitis OTC Claritin and steroid nasal spray return when necessary

## 2012-05-22 NOTE — Patient Instructions (Signed)
Take plain Claritin or Allegra in the morning,,,,,,,,,,,,,,,, or plain Zyrtec at bedtime  One shot of steroid nasal spray up each nostril at bedtime  Return when necessary

## 2012-05-27 DIAGNOSIS — Z23 Encounter for immunization: Secondary | ICD-10-CM

## 2012-06-25 ENCOUNTER — Other Ambulatory Visit: Payer: Self-pay | Admitting: Family Medicine

## 2012-07-03 ENCOUNTER — Other Ambulatory Visit: Payer: Self-pay | Admitting: Family Medicine

## 2012-07-22 ENCOUNTER — Encounter: Payer: Self-pay | Admitting: Family Medicine

## 2012-07-22 ENCOUNTER — Ambulatory Visit (INDEPENDENT_AMBULATORY_CARE_PROVIDER_SITE_OTHER): Payer: BC Managed Care – PPO | Admitting: Family Medicine

## 2012-07-22 VITALS — BP 120/84 | Temp 98.1°F | Ht 60.75 in | Wt 129.0 lb

## 2012-07-22 DIAGNOSIS — I10 Essential (primary) hypertension: Secondary | ICD-10-CM

## 2012-07-22 DIAGNOSIS — M25511 Pain in right shoulder: Secondary | ICD-10-CM

## 2012-07-22 DIAGNOSIS — Z780 Asymptomatic menopausal state: Secondary | ICD-10-CM

## 2012-07-22 DIAGNOSIS — F329 Major depressive disorder, single episode, unspecified: Secondary | ICD-10-CM

## 2012-07-22 DIAGNOSIS — M25519 Pain in unspecified shoulder: Secondary | ICD-10-CM

## 2012-07-22 DIAGNOSIS — Z23 Encounter for immunization: Secondary | ICD-10-CM

## 2012-07-22 DIAGNOSIS — N952 Postmenopausal atrophic vaginitis: Secondary | ICD-10-CM

## 2012-07-22 LAB — CBC WITH DIFFERENTIAL/PLATELET
Basophils Absolute: 0.1 10*3/uL (ref 0.0–0.1)
Eosinophils Absolute: 0.1 10*3/uL (ref 0.0–0.7)
Hemoglobin: 12.9 g/dL (ref 12.0–15.0)
Lymphocytes Relative: 40.4 % (ref 12.0–46.0)
Lymphs Abs: 2 10*3/uL (ref 0.7–4.0)
MCHC: 33.3 g/dL (ref 30.0–36.0)
Monocytes Relative: 8.6 % (ref 3.0–12.0)
Neutro Abs: 2.4 10*3/uL (ref 1.4–7.7)
Platelets: 316 10*3/uL (ref 150.0–400.0)
RDW: 12.7 % (ref 11.5–14.6)

## 2012-07-22 LAB — POCT URINALYSIS DIPSTICK
Glucose, UA: NEGATIVE
Nitrite, UA: NEGATIVE
Urobilinogen, UA: 0.2

## 2012-07-22 LAB — BASIC METABOLIC PANEL
Calcium: 9 mg/dL (ref 8.4–10.5)
GFR: 100.6 mL/min (ref >60.00)
Glucose, Bld: 87 mg/dL (ref 70–99)
Sodium: 137 mEq/L (ref 135–145)

## 2012-07-22 MED ORDER — HYDROCHLOROTHIAZIDE 25 MG PO TABS: 25.0000 mg | ORAL_TABLET | Freq: Every day | ORAL | Status: DC

## 2012-07-22 MED ORDER — POTASSIUM CHLORIDE CRYS ER 20 MEQ PO TBCR: 20.0000 meq | EXTENDED_RELEASE_TABLET | Freq: Every day | ORAL | Status: DC

## 2012-07-22 MED ORDER — ESTROGENS, CONJUGATED 0.625 MG/GM VA CREA: 1.0000 g | TOPICAL_CREAM | Freq: Every day | VAGINAL | Status: DC

## 2012-07-22 MED ORDER — SERTRALINE HCL 50 MG PO TABS: 50.0000 mg | ORAL_TABLET | Freq: Every day | ORAL | Status: DC

## 2012-07-22 NOTE — Patient Instructions (Addendum)
Continue your current medications  For the pain in your right shoulder recommend Motrin 600 mg twice daily with food range of motion exercises twice daily, ice twice daily. If we don't see any improvement in the next couple weeks then I would recommend an orthopedic consult Dr. Cleophas Dunker  Schedule your eye exam this fall  Return in one year sooner if any problems  Also we will schedule bone density

## 2012-07-22 NOTE — Progress Notes (Signed)
  Subjective:    Patient ID: Sally Shea, female    DOB: 10-01-1945, 66 y.o.   MRN: [id number]  HPI Sally Shea is a 66 year old married female who nonsmoker who comes in today for her first Medicare wellness examination  She has a history of post menopausal vaginal dryness for which she uses Premarin vaginal cream twice weekly  She has a history of mild hypertension she takes Avapro thiazide 25 mg daily BP today 120/84 she also takes a potassium supplement.  She has a history of mild depression she takes Zoloft 50 mg daily. She also takes calcium vitamin D and an aspirin tablet daily  She states she feels well except she is having pain in her right shoulder for the past 4 months. No history of trauma. She is right-handed and does a lot of physical exercise which she was a young person  Cognitive function normal she walks on a regular basis home health safety reviewed no issues identified, no guns in the house, she does have a health care power of attorney and living will.  She does not get routine eye care has not had an eye exam in over 10 years. Recommend routine eye care. She does he related no care colonoscopy was normal she does do BSE monthly and gets annual mammography. Flu shot 2013 tetanus 2003 shingles 2009 Pneumovax today also tetanus booster today.    Review of Systems  Constitutional: Negative.   HENT: Negative.   Eyes: Negative.   Respiratory: Negative.   Cardiovascular: Negative.   Gastrointestinal: Negative.   Genitourinary: Negative.   Musculoskeletal: Negative.   Neurological: Negative.   Hematological: Negative.   Psychiatric/Behavioral: Negative.        Objective:   Physical Exam  Constitutional: She appears well-developed and well-nourished.  HENT:  Head: Normocephalic and atraumatic.  Right Ear: External ear normal.  Left Ear: External ear normal.  Nose: Nose normal.  Mouth/Throat: Oropharynx is clear and moist.  Eyes: EOM are normal. Pupils are equal, round,  and reactive to light.  Neck: Normal range of motion. Neck supple. No thyromegaly present.  Cardiovascular: Normal rate, regular rhythm, normal heart sounds and intact distal pulses.  Exam reveals no gallop and no friction rub.   No murmur heard. Pulmonary/Chest: Effort normal and breath sounds normal.  Abdominal: Soft. Bowel sounds are normal. She exhibits no distension and no mass. There is no tenderness. There is no rebound.  Genitourinary:       Bilateral breast exam normal  Musculoskeletal: Normal range of motion.       Both shoulders appear normal on inspection. Full range of motion right shoulder  Lymphadenopathy:    She has no cervical adenopathy.  Neurological: She is alert. She has normal reflexes. No cranial nerve deficit. She exhibits normal muscle tone. Coordination normal.  Skin: Skin is warm and dry.  Psychiatric: She has a normal mood and affect. Her behavior is normal. Judgment and thought content normal.          Assessment & Plan:  Healthy female  Postmenopausal vaginal dryness continue Premarin cream  Mild hypertension continue hydrochlorothiazide and the potassium supplement  Pain right shoulder probably just some mild tendinitis plan Motrin 600 twice a day ice 3 times daily PT when necessary  Mild depression continue Zoloft 50 mg daily

## 2012-09-04 ENCOUNTER — Other Ambulatory Visit: Payer: Self-pay | Admitting: Family Medicine

## 2012-10-01 ENCOUNTER — Other Ambulatory Visit: Payer: Self-pay | Admitting: Family Medicine

## 2012-10-01 DIAGNOSIS — M8580 Other specified disorders of bone density and structure, unspecified site: Secondary | ICD-10-CM

## 2012-10-06 ENCOUNTER — Other Ambulatory Visit: Payer: BC Managed Care – PPO

## 2012-10-08 ENCOUNTER — Other Ambulatory Visit: Payer: BC Managed Care – PPO

## 2012-10-09 ENCOUNTER — Other Ambulatory Visit: Payer: BC Managed Care – PPO

## 2012-10-09 ENCOUNTER — Ambulatory Visit (INDEPENDENT_AMBULATORY_CARE_PROVIDER_SITE_OTHER)
Admission: RE | Admit: 2012-10-09 | Discharge: 2012-10-09 | Disposition: A | Discharge status: Home / Self Care | Payer: BC Managed Care – PPO | Source: Ambulatory Visit

## 2012-10-09 DIAGNOSIS — Z780 Asymptomatic menopausal state: Secondary | ICD-10-CM | POA: Diagnosis not present

## 2012-10-21 ENCOUNTER — Telehealth: Payer: Self-pay | Admitting: Family Medicine

## 2012-10-21 NOTE — Telephone Encounter (Signed)
Left message on machine for patient that BMD test was normal.

## 2012-10-21 NOTE — Telephone Encounter (Signed)
Pt had bmd on 10-09-12 requesting results

## 2012-11-15 ENCOUNTER — Other Ambulatory Visit: Payer: Self-pay | Admitting: Family Medicine

## 2012-11-19 ENCOUNTER — Other Ambulatory Visit: Payer: Self-pay

## 2012-11-19 MED ORDER — POTASSIUM CHLORIDE CRYS ER 20 MEQ PO TBCR: EXTENDED_RELEASE_TABLET | ORAL | Status: DC

## 2013-01-27 DIAGNOSIS — H04129 Dry eye syndrome of unspecified lacrimal gland: Secondary | ICD-10-CM | POA: Diagnosis not present

## 2013-01-27 DIAGNOSIS — Q120 Congenital cataract: Secondary | ICD-10-CM | POA: Diagnosis not present

## 2013-06-16 ENCOUNTER — Ambulatory Visit (INDEPENDENT_AMBULATORY_CARE_PROVIDER_SITE_OTHER): Payer: Medicare Other | Admitting: *Deleted

## 2013-06-16 DIAGNOSIS — Z23 Encounter for immunization: Secondary | ICD-10-CM | POA: Diagnosis not present

## 2013-06-22 ENCOUNTER — Telehealth: Payer: Self-pay | Admitting: Family Medicine

## 2013-06-22 DIAGNOSIS — M25511 Pain in right shoulder: Secondary | ICD-10-CM

## 2013-06-22 NOTE — Telephone Encounter (Signed)
Referral request placed and Left message on machine for patient

## 2013-06-22 NOTE — Telephone Encounter (Signed)
Pt states that the last time that she seen Dr.Todd, he told her that if her shoulder didn't improve, to let him know. She states that it has not improved, and she would like to see a specialist or whatever Dr. Tawanna Cooler see's fit to do at this time. Please assist.

## 2013-06-29 DIAGNOSIS — M67919 Unspecified disorder of synovium and tendon, unspecified shoulder: Secondary | ICD-10-CM | POA: Diagnosis not present

## 2013-06-29 DIAGNOSIS — M25519 Pain in unspecified shoulder: Secondary | ICD-10-CM | POA: Diagnosis not present

## 2013-07-13 DIAGNOSIS — M25519 Pain in unspecified shoulder: Secondary | ICD-10-CM | POA: Diagnosis not present

## 2013-08-04 ENCOUNTER — Telehealth: Payer: Self-pay | Admitting: Family Medicine

## 2013-08-04 NOTE — Telephone Encounter (Signed)
Left message on machine for patient to call back and schedule an appointment with Dr Kirtland Bouchard - per Dr Tawanna Cooler

## 2013-08-04 NOTE — Telephone Encounter (Signed)
Pt states she has blood discharge and is requesting to be worked in with PCP when he returns to the office on Thursday.  If she can not get in with him can she be referred to specialist?

## 2013-08-05 NOTE — Telephone Encounter (Signed)
appt sch w/ Dr Kirtland Bouchard for 12/4.

## 2013-08-06 ENCOUNTER — Encounter: Payer: Self-pay | Admitting: Internal Medicine

## 2013-08-06 ENCOUNTER — Other Ambulatory Visit (HOSPITAL_COMMUNITY)
Admission: RE | Admit: 2013-08-06 | Discharge: 2013-08-06 | Disposition: A | Discharge status: Home / Self Care | Payer: Medicare Other | Source: Ambulatory Visit | Attending: Internal Medicine | Admitting: Internal Medicine

## 2013-08-06 ENCOUNTER — Ambulatory Visit (INDEPENDENT_AMBULATORY_CARE_PROVIDER_SITE_OTHER): Payer: Medicare Other | Admitting: Internal Medicine

## 2013-08-06 VITALS — BP 136/90 | HR 95 | Temp 98.2°F | Resp 18 | Wt 122.0 lb

## 2013-08-06 DIAGNOSIS — F411 Generalized anxiety disorder: Secondary | ICD-10-CM

## 2013-08-06 DIAGNOSIS — I10 Essential (primary) hypertension: Secondary | ICD-10-CM | POA: Diagnosis not present

## 2013-08-06 DIAGNOSIS — Z01419 Encounter for gynecological examination (general) (routine) without abnormal findings: Secondary | ICD-10-CM | POA: Insufficient documentation

## 2013-08-06 DIAGNOSIS — N952 Postmenopausal atrophic vaginitis: Secondary | ICD-10-CM

## 2013-08-06 DIAGNOSIS — N950 Postmenopausal bleeding: Secondary | ICD-10-CM

## 2013-08-06 DIAGNOSIS — J069 Acute upper respiratory infection, unspecified: Secondary | ICD-10-CM

## 2013-08-06 MED ORDER — ESTROGENS, CONJUGATED 0.625 MG/GM VA CREA: 1.0000 g | TOPICAL_CREAM | Freq: Every day | VAGINAL | Status: DC

## 2013-08-06 NOTE — Progress Notes (Signed)
Subjective:    Patient ID: Sally Shea, female    DOB: October 06, 1945, 67 y.o.   MRN: [id number]  HPI  67 year old patient who presents with a chief complaint of vaginal bleeding. 6 days ago she had a single episode of scanty bloody vaginal discharge. She does have a history of atrophic vaginitis but has not been using topical estrogen cream. No other discharge or history of postmenopausal bleeding Past several days she has also had an acute febrile illness with weakness myalgias and mild sore throat. The fever broke last night and today she feels much improved no cough other symptoms include mild nausea She has a history of hypertension controlled with diuretic therapy. She has a history of mild anxiety depression and has done well on sertraline  Past Medical History  Diagnosis Date  . Anxiety   . Depression   . Headache(784.0)   . Hypertension   . Degenerative cervical disc     History   Social History  . Marital Status: Married    Spouse Name: N/A    Number of Children: N/A  . Years of Education: N/A   Occupational History  . Not on file.   Social History Main Topics  . Smoking status: Never Smoker   . Smokeless tobacco: Never Used  . Alcohol Use: No  . Drug Use: No  . Sexual Activity: Not on file   Other Topics Concern  . Not on file   Social History Narrative  . No narrative on file    Past Surgical History  Procedure Laterality Date  . Eye surgery      Family History  Problem Relation Age of Onset  . Alcohol abuse Other   . Anxiety disorder Other   . Depression Other   . Hyperlipidemia Other   . Hypertension Other     Allergies  Allergen Reactions  . Sulfamethoxazole     REACTION: thrush    Current Outpatient Prescriptions on File Prior to Visit  Medication Sig Dispense Refill  . aspirin 81 MG tablet Take 81 mg by mouth daily.        . calcium citrate-vitamin D (CITRACAL+D) 315-200 MG-UNIT per tablet Take 1 tablet by mouth daily.        . fish  oil-omega-3 fatty acids 1000 MG capsule Take 1 g by mouth daily.        . hydrochlorothiazide (HYDRODIURIL) 25 MG tablet Take 1 tablet (25 mg total) by mouth daily.  100 tablet  3  . potassium chloride SA (KLOR-CON M20) 20 MEQ tablet TAKE 1 TABLET (20 MEQ TOTAL) BY MOUTH DAILY.  100 tablet  3  . sertraline (ZOLOFT) 50 MG tablet Take 1 tablet (50 mg total) by mouth daily.  100 tablet  3   No current facility-administered medications on file prior to visit.    BP 136/90  Pulse 95  Temp(Src) 98.2 F (36.8 C) (Oral)  Resp 18  Wt 122 lb (55.339 kg)  SpO2 98%       Review of Systems  Constitutional: Positive for fever, activity change, appetite change and fatigue.  HENT: Negative for congestion, dental problem, hearing loss, rhinorrhea, sinus pressure, sore throat and tinnitus.   Eyes: Negative for pain, discharge and visual disturbance.  Respiratory: Negative for cough and shortness of breath.   Cardiovascular: Negative for chest pain, palpitations and leg swelling.  Gastrointestinal: Positive for nausea. Negative for vomiting, abdominal pain, diarrhea, constipation, blood in stool and abdominal distention.  Genitourinary: Positive for  vaginal bleeding. Negative for dysuria, urgency, frequency, hematuria, flank pain, vaginal discharge, difficulty urinating, vaginal pain and pelvic pain.  Musculoskeletal: Negative for arthralgias, gait problem and joint swelling.  Skin: Negative for rash.  Neurological: Negative for dizziness, syncope, speech difficulty, weakness, numbness and headaches.  Hematological: Negative for adenopathy.  Psychiatric/Behavioral: Negative for behavioral problems, dysphoric mood and agitation. The patient is not nervous/anxious.        Objective:   Physical Exam  Constitutional: She is oriented to person, place, and time. She appears well-developed and well-nourished.  HENT:  Head: Normocephalic and atraumatic.  Right Ear: External ear normal.  Left Ear:  External ear normal.  Mouth/Throat: Oropharynx is clear and moist.  Moderate cerumen in both canals  Eyes: Conjunctivae and EOM are normal.  Neck: Normal range of motion. Neck supple. No JVD present. No thyromegaly present.  Cardiovascular: Normal rate, regular rhythm, normal heart sounds and intact distal pulses.   No murmur heard. Pulmonary/Chest: Effort normal and breath sounds normal. She has no wheezes. She has no rales.  Abdominal: Soft. Bowel sounds are normal. She exhibits no distension and no mass. There is no tenderness. There is no rebound and no guarding.  Genitourinary: Uterus normal. No vaginal discharge found.  Atrophic vaginal mucosa Cervix normal No adnexal masses  Pap specimen obtained  Musculoskeletal: Normal range of motion. She exhibits no edema and no tenderness.  Neurological: She is alert and oriented to person, place, and time. She has normal reflexes. No cranial nerve deficit. She exhibits normal muscle tone. Coordination normal.  Skin: Skin is warm and dry. No rash noted.  Psychiatric: She has a normal mood and affect. Her behavior is normal.          Assessment & Plan:   Viral URI. Patient is now afebrile and feels improved. Will observe Atrophic vaginitis. Patient has had a single episode of small vaginal bleeding. Options discussed including gynecologic referral. It was elected to perform a Pap, resume topical estrogen therapy and observe. Will need gynecologic referral if there is any further vaginal bleeding Hypertension well controlled

## 2013-08-06 NOTE — Patient Instructions (Addendum)
Call if you experience any further vaginal bleeding for gynecologic referral  Limit your sodium (Salt) intake    It is important that you exercise regularly, at least 20 minutes 3 to 4 times per week.  If you develop chest pain or shortness of breath seek  medical attention.

## 2013-08-06 NOTE — Progress Notes (Signed)
Pre-visit discussion using our clinic review tool. No additional management support is needed unless otherwise documented below in the visit note.  

## 2013-09-12 ENCOUNTER — Telehealth: Payer: Self-pay | Admitting: Family Medicine

## 2013-09-12 DIAGNOSIS — F3289 Other specified depressive episodes: Secondary | ICD-10-CM

## 2013-09-12 DIAGNOSIS — F329 Major depressive disorder, single episode, unspecified: Secondary | ICD-10-CM

## 2013-09-12 NOTE — Telephone Encounter (Signed)
[place], [address] requesting refill of sertraline (ZOLOFT) 50 MG tablet #90, last filled 05/21/13

## 2013-09-14 MED ORDER — SERTRALINE HCL 50 MG PO TABS: 50.0000 mg | ORAL_TABLET | Freq: Every day | ORAL | Status: DC

## 2013-10-29 ENCOUNTER — Encounter: Payer: BC Managed Care – PPO | Admitting: Family Medicine

## 2013-12-02 ENCOUNTER — Encounter: Payer: BC Managed Care – PPO | Admitting: Family Medicine

## 2014-01-06 ENCOUNTER — Encounter: Payer: Self-pay | Admitting: Family Medicine

## 2014-01-06 ENCOUNTER — Ambulatory Visit (INDEPENDENT_AMBULATORY_CARE_PROVIDER_SITE_OTHER): Payer: Medicare Other | Admitting: Family Medicine

## 2014-01-06 ENCOUNTER — Telehealth: Payer: Self-pay | Admitting: Family Medicine

## 2014-01-06 VITALS — BP 130/80 | Temp 98.5°F | Ht 60.25 in | Wt 124.0 lb

## 2014-01-06 DIAGNOSIS — F329 Major depressive disorder, single episode, unspecified: Secondary | ICD-10-CM | POA: Diagnosis not present

## 2014-01-06 DIAGNOSIS — Z0000 Encounter for general adult medical examination without abnormal findings: Secondary | ICD-10-CM | POA: Diagnosis not present

## 2014-01-06 DIAGNOSIS — I10 Essential (primary) hypertension: Secondary | ICD-10-CM | POA: Diagnosis not present

## 2014-01-06 DIAGNOSIS — F3289 Other specified depressive episodes: Secondary | ICD-10-CM | POA: Diagnosis not present

## 2014-01-06 DIAGNOSIS — N952 Postmenopausal atrophic vaginitis: Secondary | ICD-10-CM

## 2014-01-06 DIAGNOSIS — Z23 Encounter for immunization: Secondary | ICD-10-CM

## 2014-01-06 LAB — BASIC METABOLIC PANEL
BUN: 13 mg/dL (ref 6–23)
CALCIUM: 9.6 mg/dL (ref 8.4–10.5)
CO2: 32 meq/L (ref 19–32)
CREATININE: 0.7 mg/dL (ref 0.4–1.2)
Chloride: 102 mEq/L (ref 96–112)
GFR: 93.29 mL/min (ref >60.00)
GLUCOSE: 76 mg/dL (ref 70–99)
Potassium: 3.7 mEq/L (ref 3.5–5.1)
Sodium: 140 mEq/L (ref 135–145)

## 2014-01-06 LAB — CBC WITH DIFFERENTIAL/PLATELET
BASOS PCT: 0.8 % (ref 0.0–3.0)
Basophils Absolute: 0 10*3/uL (ref 0.0–0.1)
EOS PCT: 2.2 % (ref 0.0–5.0)
Eosinophils Absolute: 0.1 10*3/uL (ref 0.0–0.7)
HEMATOCRIT: 39.7 % (ref 36.0–46.0)
Hemoglobin: 13.5 g/dL (ref 12.0–15.0)
LYMPHS ABS: 2.2 10*3/uL (ref 0.7–4.0)
Lymphocytes Relative: 37.5 % (ref 12.0–46.0)
MCHC: 34 g/dL (ref 30.0–36.0)
MCV: 90.9 fl (ref 78.0–100.0)
MONO ABS: 0.5 10*3/uL (ref 0.1–1.0)
Monocytes Relative: 8.3 % (ref 3.0–12.0)
Neutro Abs: 3.1 10*3/uL (ref 1.4–7.7)
Neutrophils Relative %: 51.2 % (ref 43.0–77.0)
Platelets: 379 10*3/uL (ref 150.0–400.0)
RBC: 4.37 Mil/uL (ref 3.87–5.11)
RDW: 12.8 % (ref 11.5–15.5)
WBC: 6 10*3/uL (ref 4.0–10.5)

## 2014-01-06 LAB — TSH: TSH: 1.72 u[IU]/mL (ref 0.35–4.50)

## 2014-01-06 MED ORDER — TRIAMTERENE-HCTZ 37.5-25 MG PO CAPS: 1.0000 | ORAL_CAPSULE | Freq: Every day | ORAL | Status: DC

## 2014-01-06 MED ORDER — TRIAMTERENE-HCTZ 50-25 MG PO CAPS: 1.0000 | ORAL_CAPSULE | ORAL | Status: DC

## 2014-01-06 MED ORDER — SERTRALINE HCL 50 MG PO TABS: 50.0000 mg | ORAL_TABLET | Freq: Every day | ORAL | Status: DC

## 2014-01-06 MED ORDER — ESTROGENS, CONJUGATED 0.625 MG/GM VA CREA: 1.0000 g | TOPICAL_CREAM | Freq: Every day | VAGINAL | Status: DC

## 2014-01-06 NOTE — Progress Notes (Signed)
Pre visit review using our clinic review tool, if applicable. No additional management support is needed unless otherwise documented below in the visit note. 

## 2014-01-06 NOTE — Patient Instructions (Addendum)
Continue your current medications  Continue daily exercise  Return in one year sooner if any problems  Stop the hydrochlorothiazide and potassium supplement  Dyazide.,...... 1 daily in the morning,,,,,,,,,,,,,,, 37.525

## 2014-01-06 NOTE — Progress Notes (Signed)
   Subjective:    Patient ID: Sally Shea, female    DOB: February 04, 1946, 68 y.o.   MRN: [id number]  HPI Sally Shea is a 68 year old married female nonsmoker G2 P2 who comes in today for a Medicare wellness examination  She is a history of underlying hypertension and takes a diuretic and potassium supplement daily. BP normal  His history of mild depression and she takes Zoloft 50 mg at bedtime.  She gets routine eye care, dental care, BSE monthly, and you mammography, colonoscopy up-to-date  Vaccinations updated by Sally Shea  Cognitive function normal she walks on a regular basis home health safety reviewed no issues identified, no guns in the house, she does have a health care power of attorney and living well  She was very months ago because of some vaginal spotting and/or bleeding. Pelvic exam and Pap were normal. She's had no further bleeding   Review of Systems  Constitutional: Negative.   HENT: Negative.   Eyes: Negative.   Respiratory: Negative.   Cardiovascular: Negative.   Gastrointestinal: Negative.   Genitourinary: Negative.   Musculoskeletal: Negative.   Neurological: Negative.   Psychiatric/Behavioral: Negative.        Objective:   Physical Exam  Nursing note and vitals reviewed. Constitutional: She appears well-developed and well-nourished.  HENT:  Head: Normocephalic and atraumatic.  Right Ear: External ear normal.  Left Ear: External ear normal.  Nose: Nose normal.  Mouth/Throat: Oropharynx is clear and moist.  Eyes: EOM are normal. Pupils are equal, round, and reactive to light.  Neck: Normal range of motion. Neck supple. No thyromegaly present.  Cardiovascular: Normal rate, regular rhythm, normal heart sounds and intact distal pulses.  Exam reveals no gallop and no friction rub.   No murmur heard. Pulmonary/Chest: Effort normal and breath sounds normal.  Abdominal: Soft. Bowel sounds are normal. She exhibits no distension and no mass. There is no tenderness. There  is no rebound.  Genitourinary:  Bilateral breast exam normal  Musculoskeletal: Normal range of motion.  Lymphadenopathy:    She has no cervical adenopathy.  Neurological: She is alert. She has normal reflexes. No cranial nerve deficit. She exhibits normal muscle tone. Coordination normal.  Skin: Skin is warm and dry.  Total body skin exam normal  Psychiatric: She has a normal mood and affect. Her behavior is normal. Judgment and thought content normal.          Assessment & Plan:  Healthy female  Attention mild control with the diuretic and potassium supplement  Postmenopausal vaginal dryness Premarin cream twice weekly  History of mild depression continue Zoloft

## 2014-01-06 NOTE — Telephone Encounter (Signed)
Relevant patient education assigned to patient using Emmi. ° °

## 2014-05-03 ENCOUNTER — Encounter: Payer: Self-pay | Admitting: Family Medicine

## 2014-05-03 ENCOUNTER — Ambulatory Visit (INDEPENDENT_AMBULATORY_CARE_PROVIDER_SITE_OTHER): Payer: Medicare Other | Admitting: Family Medicine

## 2014-05-03 VITALS — BP 128/80

## 2014-05-03 DIAGNOSIS — Z23 Encounter for immunization: Secondary | ICD-10-CM

## 2014-05-03 DIAGNOSIS — M5430 Sciatica, unspecified side: Secondary | ICD-10-CM | POA: Diagnosis not present

## 2014-05-03 DIAGNOSIS — K2100 Gastro-esophageal reflux disease with esophagitis, without bleeding: Secondary | ICD-10-CM | POA: Diagnosis not present

## 2014-05-03 DIAGNOSIS — M5442 Lumbago with sciatica, left side: Secondary | ICD-10-CM

## 2014-05-03 MED ORDER — DIAZEPAM 5 MG PO TABS: ORAL_TABLET | ORAL | Status: DC

## 2014-05-03 MED ORDER — TRAMADOL HCL 50 MG PO TABS: ORAL_TABLET | ORAL | Status: DC

## 2014-05-03 MED ORDER — PREDNISONE 20 MG PO TABS: ORAL_TABLET | ORAL | Status: DC
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
## 2014-05-03 NOTE — Patient Instructions (Signed)
Tramadol 50 mg......... one half to one tablet at bedtime for pain  Valium 5 mg........... one half to one tablet at bedtime for muscle spasm  Prednisone 20 mg................ one tablet x5 days, a half a tab x5 days, then a half a tablet Monday Wednesday Friday for a two-week taper  Physical therapy consult with Noel Gerold  For the reflux esophagitis........... OTC Prilosec 20 mg twice daily......... avoid caffeine and peppermint.......... nothing to either drink for 2 hours prior to bedtime........ 2 pillows.....Marland Kitchen if symptoms persist after 3 weeks then we will request a GI consult

## 2014-05-13 ENCOUNTER — Other Ambulatory Visit: Payer: Self-pay | Admitting: *Deleted

## 2014-05-13 DIAGNOSIS — F329 Major depressive disorder, single episode, unspecified: Secondary | ICD-10-CM

## 2014-05-13 DIAGNOSIS — F3289 Other specified depressive episodes: Secondary | ICD-10-CM

## 2014-05-13 MED ORDER — SERTRALINE HCL 50 MG PO TABS: 50.0000 mg | ORAL_TABLET | Freq: Every day | ORAL | Status: DC

## 2014-05-20 DIAGNOSIS — M545 Low back pain, unspecified: Secondary | ICD-10-CM | POA: Diagnosis not present

## 2014-05-20 DIAGNOSIS — M5430 Sciatica, unspecified side: Secondary | ICD-10-CM | POA: Diagnosis not present

## 2014-06-11 ENCOUNTER — Ambulatory Visit (INDEPENDENT_AMBULATORY_CARE_PROVIDER_SITE_OTHER): Payer: Medicare Other | Admitting: *Deleted

## 2014-06-11 DIAGNOSIS — Z23 Encounter for immunization: Secondary | ICD-10-CM | POA: Diagnosis not present

## 2014-09-28 ENCOUNTER — Other Ambulatory Visit: Payer: Self-pay | Admitting: *Deleted

## 2014-09-28 DIAGNOSIS — F32A Depression, unspecified: Secondary | ICD-10-CM

## 2014-09-28 DIAGNOSIS — F329 Major depressive disorder, single episode, unspecified: Secondary | ICD-10-CM

## 2014-09-28 MED ORDER — SERTRALINE HCL 50 MG PO TABS: 50.0000 mg | ORAL_TABLET | Freq: Every day | ORAL | Status: DC

## 2014-10-26 DIAGNOSIS — E039 Hypothyroidism, unspecified: Secondary | ICD-10-CM | POA: Diagnosis not present

## 2014-10-26 DIAGNOSIS — N950 Postmenopausal bleeding: Secondary | ICD-10-CM | POA: Diagnosis not present

## 2014-12-09 ENCOUNTER — Encounter: Payer: Self-pay | Admitting: Internal Medicine

## 2015-01-14 ENCOUNTER — Other Ambulatory Visit: Payer: Self-pay | Admitting: *Deleted

## 2015-01-14 ENCOUNTER — Telehealth: Payer: Self-pay | Admitting: Family Medicine

## 2015-01-14 DIAGNOSIS — I10 Essential (primary) hypertension: Secondary | ICD-10-CM

## 2015-01-14 MED ORDER — TRIAMTERENE-HCTZ 37.5-25 MG PO CAPS: 1.0000 | ORAL_CAPSULE | Freq: Every day | ORAL | Status: DC

## 2015-01-14 NOTE — Telephone Encounter (Signed)
Needs refill of her triamterene/HCTZ called into [facility] please.

## 2015-01-14 NOTE — Telephone Encounter (Signed)
Please schedule. thanks

## 2015-01-14 NOTE — Telephone Encounter (Signed)
Spoke with patient and she will schedule her mammogram when she comes for her office visit.

## 2015-01-14 NOTE — Telephone Encounter (Signed)
Pt has been sch

## 2015-01-14 NOTE — Telephone Encounter (Signed)
She would also like a CPE before Dr Sherren Mocha goes out for the summer.

## 2015-01-14 NOTE — Telephone Encounter (Signed)
lmom for pt to call back

## 2015-01-27 ENCOUNTER — Other Ambulatory Visit: Payer: Self-pay

## 2015-01-27 ENCOUNTER — Ambulatory Visit (INDEPENDENT_AMBULATORY_CARE_PROVIDER_SITE_OTHER): Payer: Medicare Other | Admitting: Family Medicine

## 2015-01-27 ENCOUNTER — Encounter: Payer: Self-pay | Admitting: Family Medicine

## 2015-01-27 VITALS — BP 120/80 | Temp 98.1°F | Ht 60.5 in | Wt 122.0 lb

## 2015-01-27 DIAGNOSIS — N952 Postmenopausal atrophic vaginitis: Secondary | ICD-10-CM | POA: Diagnosis not present

## 2015-01-27 DIAGNOSIS — Z1231 Encounter for screening mammogram for malignant neoplasm of breast: Secondary | ICD-10-CM

## 2015-01-27 DIAGNOSIS — F32A Depression, unspecified: Secondary | ICD-10-CM

## 2015-01-27 DIAGNOSIS — F329 Major depressive disorder, single episode, unspecified: Secondary | ICD-10-CM

## 2015-01-27 DIAGNOSIS — Z0000 Encounter for general adult medical examination without abnormal findings: Secondary | ICD-10-CM | POA: Diagnosis not present

## 2015-01-27 DIAGNOSIS — I10 Essential (primary) hypertension: Secondary | ICD-10-CM | POA: Diagnosis not present

## 2015-01-27 DIAGNOSIS — M5442 Lumbago with sciatica, left side: Secondary | ICD-10-CM

## 2015-01-27 LAB — BASIC METABOLIC PANEL
BUN: 16 mg/dL (ref 6–23)
CALCIUM: 9.5 mg/dL (ref 8.4–10.5)
CHLORIDE: 100 meq/L (ref 96–112)
CO2: 32 mEq/L (ref 19–32)
CREATININE: 0.8 mg/dL (ref 0.40–1.20)
GFR: 75.78 mL/min (ref >60.00)
Glucose, Bld: 88 mg/dL (ref 70–99)
POTASSIUM: 3.4 meq/L — AB (ref 3.5–5.1)
SODIUM: 138 meq/L (ref 135–145)

## 2015-01-27 LAB — CBC WITH DIFFERENTIAL/PLATELET
Basophils Absolute: 0.1 10*3/uL (ref 0.0–0.1)
Basophils Relative: 1.1 % (ref 0.0–3.0)
EOS ABS: 0.1 10*3/uL (ref 0.0–0.7)
Eosinophils Relative: 2.6 % (ref 0.0–5.0)
HCT: 38.7 % (ref 36.0–46.0)
Hemoglobin: 13.2 g/dL (ref 12.0–15.0)
Lymphocytes Relative: 39.9 % (ref 12.0–46.0)
Lymphs Abs: 2.1 10*3/uL (ref 0.7–4.0)
MCHC: 34.1 g/dL (ref 30.0–36.0)
MCV: 89.3 fl (ref 78.0–100.0)
MONO ABS: 0.5 10*3/uL (ref 0.1–1.0)
Monocytes Relative: 9 % (ref 3.0–12.0)
Neutro Abs: 2.5 10*3/uL (ref 1.4–7.7)
Neutrophils Relative %: 47.4 % (ref 43.0–77.0)
Platelets: 366 10*3/uL (ref 150.0–400.0)
RBC: 4.33 Mil/uL (ref 3.87–5.11)
RDW: 12.6 % (ref 11.5–15.5)
WBC: 5.2 10*3/uL (ref 4.0–10.5)

## 2015-01-27 LAB — HEPATIC FUNCTION PANEL
ALBUMIN: 4.3 g/dL (ref 3.5–5.2)
ALT: 15 U/L (ref 0–35)
AST: 20 U/L (ref 0–37)
Alkaline Phosphatase: 63 U/L (ref 39–117)
BILIRUBIN DIRECT: 0.1 mg/dL (ref 0.0–0.3)
Total Bilirubin: 0.4 mg/dL (ref 0.2–1.2)
Total Protein: 7 g/dL (ref 6.0–8.3)

## 2015-01-27 LAB — LIPID PANEL
CHOLESTEROL: 242 mg/dL — AB (ref 0–200)
HDL: 57.1 mg/dL (ref >39.00)
LDL Cholesterol: 169 mg/dL — ABNORMAL HIGH (ref 0–99)
NONHDL: 184.9
Total CHOL/HDL Ratio: 4
Triglycerides: 81 mg/dL (ref 0.0–149.0)
VLDL: 16.2 mg/dL (ref 0.0–40.0)

## 2015-01-27 LAB — TSH: TSH: 1.65 u[IU]/mL (ref 0.35–4.50)

## 2015-01-27 MED ORDER — ESTROGENS, CONJUGATED 0.625 MG/GM VA CREA: 1.0000 g | TOPICAL_CREAM | Freq: Every day | VAGINAL | Status: DC

## 2015-01-27 MED ORDER — TRIAMTERENE-HCTZ 37.5-25 MG PO CAPS: 1.0000 | ORAL_CAPSULE | Freq: Every day | ORAL | Status: DC

## 2015-01-27 MED ORDER — SERTRALINE HCL 50 MG PO TABS: 50.0000 mg | ORAL_TABLET | Freq: Every day | ORAL | Status: DC

## 2015-01-27 MED ORDER — DIAZEPAM 5 MG PO TABS: ORAL_TABLET | ORAL | Status: DC

## 2015-01-27 NOTE — Patient Instructions (Signed)
Continue current medications  Return in one year sooner if any problems

## 2015-01-27 NOTE — Progress Notes (Signed)
   Subjective:    Patient ID: Sally Shea, female    DOB: 1945/10/09, 69 y.o.   MRN: [id number]  HPI Sally Shea is a 69 year old married female nonsmoker G2 P2 who comes in today for general physical examination because of a history of mild depression, mild hypertension, postmenopausal vaginal dryness  She uses Dyazide 1 daily BP 120/80  She takes Zoloft 50 mg daily because of a history of mild depression  Uses Premarin cream couple times weekly for vaginal dryness  She gets routine eye care, dental care, last mammogram was 2012. Advised to get annual mammography. She had a pelvic Pap breast exam 4 months ago GYN all that was normal. She went there because she had some spotting.  She's due for colonoscopy but she's not sure when. I asked her to call GI to find out  Vaccinations up-to-date  Cognitive function normal she walks daily home health safety reviewed no issues identified, no guns in the house, she does have a healthcare power of attorney and living well   Review of Systems  Constitutional: Negative.   HENT: Negative.   Eyes: Negative.   Respiratory: Negative.   Cardiovascular: Negative.   Gastrointestinal: Negative.   Endocrine: Negative.   Genitourinary: Negative.   Musculoskeletal: Negative.   Skin: Negative.   Allergic/Immunologic: Negative.   Neurological: Negative.   Hematological: Negative.   Psychiatric/Behavioral: Negative.        Objective:   Physical Exam  Constitutional: She appears well-developed and well-nourished.  HENT:  Head: Normocephalic and atraumatic.  Right Ear: External ear normal.  Left Ear: External ear normal.  Nose: Nose normal.  Mouth/Throat: Oropharynx is clear and moist.  Eyes: EOM are normal. Pupils are equal, round, and reactive to light.  Neck: Normal range of motion. Neck supple. No JVD present. No tracheal deviation present. No thyromegaly present.  Cardiovascular: Normal rate, regular rhythm, normal heart sounds and intact distal  pulses.  Exam reveals no gallop and no friction rub.   No murmur heard. Pulmonary/Chest: Effort normal and breath sounds normal. No stridor. No respiratory distress. She has no wheezes. She has no rales. She exhibits no tenderness.  Abdominal: Soft. Bowel sounds are normal. She exhibits no distension and no mass. There is no tenderness. There is no rebound and no guarding.  Musculoskeletal: Normal range of motion.  Lymphadenopathy:    She has no cervical adenopathy.  Neurological: She is alert. She has normal reflexes. No cranial nerve deficit. She exhibits normal muscle tone. Coordination normal.  Skin: Skin is warm and dry. No rash noted. No erythema. No pallor.  Total body skin exam normal  Psychiatric: She has a normal mood and affect. Her behavior is normal. Judgment and thought content normal.          Assessment & Plan:  Healthy female  Mild hypertension at goal..... Continue current therapy  History of mild depression......... continue Zoloft 50 mg at bedtime  Postmenopausal vaginal dryness......... Premarin cream 2-3 times weekly when necessary.

## 2015-01-27 NOTE — Progress Notes (Signed)
Pre visit review using our clinic review tool, if applicable. No additional management support is needed unless otherwise documented below in the visit note. 

## 2015-01-28 ENCOUNTER — Ambulatory Visit
Admission: RE | Admit: 2015-01-28 | Discharge: 2015-01-28 | Disposition: A | Discharge status: Home / Self Care | Payer: Medicare Other | Source: Ambulatory Visit

## 2015-01-28 DIAGNOSIS — Z1231 Encounter for screening mammogram for malignant neoplasm of breast: Secondary | ICD-10-CM | POA: Diagnosis not present

## 2015-02-03 ENCOUNTER — Encounter: Payer: Self-pay | Admitting: Internal Medicine

## 2015-03-23 ENCOUNTER — Ambulatory Visit (AMBULATORY_SURGERY_CENTER): Payer: Self-pay | Admitting: *Deleted

## 2015-03-23 VITALS — Ht 61.0 in | Wt 126.6 lb

## 2015-03-23 DIAGNOSIS — Z1211 Encounter for screening for malignant neoplasm of colon: Secondary | ICD-10-CM

## 2015-03-23 MED ORDER — NA SULFATE-K SULFATE-MG SULF 17.5-3.13-1.6 GM/177ML PO SOLN: 1.0000 | Freq: Once | ORAL | Status: DC

## 2015-03-23 NOTE — Progress Notes (Signed)
No egg or soy allergy No issues wit past sedation No diet pills No home 02  emmi video declined

## 2015-03-30 ENCOUNTER — Encounter: Payer: Self-pay | Admitting: Gastroenterology

## 2015-04-06 ENCOUNTER — Encounter: Payer: Medicare Other | Admitting: Internal Medicine

## 2015-05-06 DIAGNOSIS — H52222 Regular astigmatism, left eye: Secondary | ICD-10-CM | POA: Diagnosis not present

## 2015-05-06 DIAGNOSIS — H524 Presbyopia: Secondary | ICD-10-CM | POA: Diagnosis not present

## 2015-05-06 DIAGNOSIS — H52221 Regular astigmatism, right eye: Secondary | ICD-10-CM | POA: Diagnosis not present

## 2015-05-06 DIAGNOSIS — H5211 Myopia, right eye: Secondary | ICD-10-CM | POA: Diagnosis not present

## 2015-05-06 DIAGNOSIS — D3132 Benign neoplasm of left choroid: Secondary | ICD-10-CM | POA: Diagnosis not present

## 2015-05-06 DIAGNOSIS — H2513 Age-related nuclear cataract, bilateral: Secondary | ICD-10-CM | POA: Diagnosis not present

## 2015-05-24 ENCOUNTER — Encounter: Payer: Self-pay | Admitting: Gastroenterology

## 2015-05-24 ENCOUNTER — Ambulatory Visit (AMBULATORY_SURGERY_CENTER): Payer: Medicare Other | Admitting: Gastroenterology

## 2015-05-24 VITALS — BP 128/76 | HR 64 | Temp 97.5°F | Resp 16 | Ht 61.0 in | Wt 126.0 lb

## 2015-05-24 DIAGNOSIS — Z1211 Encounter for screening for malignant neoplasm of colon: Secondary | ICD-10-CM | POA: Diagnosis not present

## 2015-05-24 DIAGNOSIS — I10 Essential (primary) hypertension: Secondary | ICD-10-CM | POA: Diagnosis not present

## 2015-05-24 MED ORDER — SODIUM CHLORIDE 0.9 % IV SOLN: 500.0000 mL | INTRAVENOUS | Status: DC

## 2015-05-24 NOTE — Op Note (Signed)
[facility]  [address]. [place], [id number]   COLONOSCOPY PROCEDURE REPORT  PATIENT: Sally Shea, Sally Shea  MR#: [id number] BIRTHDATE: Jul 19, 1946 , 69  yrs. old GENDER: female ENDOSCOPIST: [place] Cellar, MD REFERRED BY: Stevie Kern, MD PROCEDURE DATE:  05/24/2015 PROCEDURE:   Colonoscopy, screening First Screening Colonoscopy - Avg.  risk and is 50 yrs.  old or older - No.  Prior Negative Screening - Now for repeat screening. 10 or more years since last screening  History of Adenoma - Now for follow-up colonoscopy & has been > or = to 3 yrs.  N/A  Polyps removed today? No Recommend repeat exam, <10 yrs? No ASA CLASS:   Class II INDICATIONS:Screening for colonic neoplasia and Colorectal Neoplasm Risk Assessment for this procedure is average risk. MEDICATIONS: Propofol 175 mg IV  DESCRIPTION OF PROCEDURE:   After the risks benefits and alternatives of the procedure were thoroughly explained, informed consent was obtained.  The digital rectal exam revealed no abnormalities of the rectum.   The LB [id number]  endoscope was introduced through the anus and advanced to the cecum, which was identified by both the appendix and ileocecal valve. No adverse events experienced.   The quality of the prep was good.  The instrument was then slowly withdrawn as the colon was fully examined. Estimated blood loss is zero unless otherwise noted in this procedure report.      COLON FINDINGS: A normal appearing cecum, ileocecal valve, and appendiceal orifice were identified.  The ascending, transverse, descending, sigmoid colon, and rectum appeared unremarkable.  No polyps or mas lesions noted. Retroflexed views revealed no abnormalities. The time to cecum = 3.3 Withdrawal time = 9.9   The scope was withdrawn and the procedure completed. COMPLICATIONS: There were no immediate complications.  ENDOSCOPIC IMPRESSION: Normal colonoscopy  RECOMMENDATIONS: Resume  diet Resume medications Repeat colon cancer screening in 10 years if you wish to have further screening exams  eSigned:  [place] Cellar, MD 05/24/2015 10:59 AM   cc: Stevie Kern MD, patient

## 2015-05-24 NOTE — Progress Notes (Signed)
Transferred to recovery room. A/O x3, pleased with MAC.  VSS.  Report to Jane, RN. 

## 2015-05-24 NOTE — Patient Instructions (Signed)
YOU HAD AN ENDOSCOPIC PROCEDURE TODAY AT THE [facility]:   Refer to the procedure report that was given to you for any specific questions about what was found during the examination.  If the procedure report does not answer your questions, please call your gastroenterologist to clarify.  If you requested that your care partner not be given the details of your procedure findings, then the procedure report has been included in a sealed envelope for you to review at your convenience later.  YOU SHOULD EXPECT: Some feelings of bloating in the abdomen. Passage of more gas than usual.  Walking can help get rid of the air that was put into your GI tract during the procedure and reduce the bloating. If you had a lower endoscopy (such as a colonoscopy or flexible sigmoidoscopy) you may notice spotting of blood in your stool or on the toilet paper. If you underwent a bowel prep for your procedure, you may not have a normal bowel movement for a few days.  Please Note:  You might notice some irritation and congestion in your nose or some drainage.  This is from the oxygen used during your procedure.  There is no need for concern and it should clear up in a day or so.  SYMPTOMS TO REPORT IMMEDIATELY:   Following lower endoscopy (colonoscopy or flexible sigmoidoscopy):  Excessive amounts of blood in the stool  Significant tenderness or worsening of abdominal pains  Swelling of the abdomen that is new, acute  Fever of 100F or higher   Following upper endoscopy (EGD)  Vomiting of blood or coffee ground material  New chest pain or pain under the shoulder blades  Painful or persistently difficult swallowing  New shortness of breath  Fever of 100F or higher  Black, tarry-looking stools  For urgent or emergent issues, a gastroenterologist can be reached at any hour by calling [phone].   DIET: Your first meal following the procedure should be a small meal and then it is ok to progress to  your normal diet. Heavy or fried foods are harder to digest and may make you feel nauseous or bloated.  Likewise, meals heavy in dairy and vegetables can increase bloating.  Drink plenty of fluids but you should avoid alcoholic beverages for 24 hours.  ACTIVITY:  You should plan to take it easy for the rest of today and you should NOT DRIVE or use heavy machinery until tomorrow (because of the sedation medicines used during the test).    FOLLOW UP: Our staff will call the number listed on your records the next business day following your procedure to check on you and address any questions or concerns that you may have regarding the information given to you following your procedure. If we do not reach you, we will leave a message.  However, if you are feeling well and you are not experiencing any problems, there is no need to return our call.  We will assume that you have returned to your regular daily activities without incident.  If any biopsies were taken you will be contacted by phone or by letter within the next 1-3 weeks.  Please call us at [phone] if you have not heard about the biopsies in 3 weeks.    SIGNATURES/CONFIDENTIALITY: You and/or your care partner have signed paperwork which will be entered into your electronic medical record.  These signatures attest to the fact that that the information above on your After Visit Summary has been reviewed   and is understood.  Full responsibility of the confidentiality of this discharge information lies with you and/or your care-partner. 

## 2015-05-25 ENCOUNTER — Telehealth: Payer: Self-pay | Admitting: *Deleted

## 2015-05-25 NOTE — Telephone Encounter (Signed)
Message left

## 2015-06-29 ENCOUNTER — Ambulatory Visit (INDEPENDENT_AMBULATORY_CARE_PROVIDER_SITE_OTHER): Payer: Medicare Other | Admitting: *Deleted

## 2015-06-29 DIAGNOSIS — Z23 Encounter for immunization: Secondary | ICD-10-CM

## 2015-08-11 DIAGNOSIS — H2513 Age-related nuclear cataract, bilateral: Secondary | ICD-10-CM | POA: Diagnosis not present

## 2015-08-11 DIAGNOSIS — D3132 Benign neoplasm of left choroid: Secondary | ICD-10-CM | POA: Diagnosis not present

## 2015-08-31 DIAGNOSIS — H25813 Combined forms of age-related cataract, bilateral: Secondary | ICD-10-CM | POA: Diagnosis not present

## 2015-08-31 DIAGNOSIS — D3132 Benign neoplasm of left choroid: Secondary | ICD-10-CM | POA: Diagnosis not present

## 2015-08-31 DIAGNOSIS — H3122 Choroidal dystrophy (central areolar) (generalized) (peripapillary): Secondary | ICD-10-CM | POA: Diagnosis not present

## 2016-01-10 ENCOUNTER — Other Ambulatory Visit: Payer: Self-pay

## 2016-01-10 DIAGNOSIS — F329 Major depressive disorder, single episode, unspecified: Secondary | ICD-10-CM

## 2016-01-10 DIAGNOSIS — F32A Depression, unspecified: Secondary | ICD-10-CM

## 2016-01-10 MED ORDER — SERTRALINE HCL 50 MG PO TABS: 50.0000 mg | ORAL_TABLET | Freq: Every day | ORAL | Status: DC

## 2016-01-31 ENCOUNTER — Encounter: Payer: Medicare Other | Admitting: Family Medicine

## 2016-02-03 DIAGNOSIS — H25813 Combined forms of age-related cataract, bilateral: Secondary | ICD-10-CM | POA: Diagnosis not present

## 2016-02-03 DIAGNOSIS — D3132 Benign neoplasm of left choroid: Secondary | ICD-10-CM | POA: Diagnosis not present

## 2016-02-03 DIAGNOSIS — H3122 Choroidal dystrophy (central areolar) (generalized) (peripapillary): Secondary | ICD-10-CM | POA: Diagnosis not present

## 2016-02-09 ENCOUNTER — Other Ambulatory Visit: Payer: Self-pay | Admitting: Family Medicine

## 2016-02-09 DIAGNOSIS — Z1231 Encounter for screening mammogram for malignant neoplasm of breast: Secondary | ICD-10-CM

## 2016-02-14 DIAGNOSIS — L821 Other seborrheic keratosis: Secondary | ICD-10-CM | POA: Diagnosis not present

## 2016-02-14 DIAGNOSIS — D1722 Benign lipomatous neoplasm of skin and subcutaneous tissue of left arm: Secondary | ICD-10-CM | POA: Diagnosis not present

## 2016-02-14 DIAGNOSIS — D225 Melanocytic nevi of trunk: Secondary | ICD-10-CM | POA: Diagnosis not present

## 2016-02-14 DIAGNOSIS — L814 Other melanin hyperpigmentation: Secondary | ICD-10-CM | POA: Diagnosis not present

## 2016-02-14 DIAGNOSIS — Z85828 Personal history of other malignant neoplasm of skin: Secondary | ICD-10-CM | POA: Diagnosis not present

## 2016-02-14 DIAGNOSIS — L918 Other hypertrophic disorders of the skin: Secondary | ICD-10-CM | POA: Diagnosis not present

## 2016-02-22 ENCOUNTER — Ambulatory Visit: Payer: Medicare Other

## 2016-02-24 ENCOUNTER — Ambulatory Visit
Admission: RE | Admit: 2016-02-24 | Discharge: 2016-02-24 | Disposition: A | Discharge status: Home / Self Care | Payer: Medicare Other | Source: Ambulatory Visit | Attending: Family Medicine | Admitting: Family Medicine

## 2016-02-24 DIAGNOSIS — Z1231 Encounter for screening mammogram for malignant neoplasm of breast: Secondary | ICD-10-CM

## 2016-05-01 ENCOUNTER — Other Ambulatory Visit: Payer: Self-pay | Admitting: Family Medicine

## 2016-05-01 DIAGNOSIS — F329 Major depressive disorder, single episode, unspecified: Secondary | ICD-10-CM

## 2016-05-01 DIAGNOSIS — F32A Depression, unspecified: Secondary | ICD-10-CM

## 2016-05-01 DIAGNOSIS — I10 Essential (primary) hypertension: Secondary | ICD-10-CM

## 2016-05-01 MED ORDER — TRIAMTERENE-HCTZ 37.5-25 MG PO CAPS: 1.0000 | ORAL_CAPSULE | Freq: Every day | ORAL | 0 refills | Status: DC

## 2016-05-01 MED ORDER — SERTRALINE HCL 50 MG PO TABS: 50.0000 mg | ORAL_TABLET | Freq: Every day | ORAL | 2 refills | Status: DC

## 2016-05-01 NOTE — Addendum Note (Signed)
Addended by: Miles Costain T on: 05/01/2016 01:56 PM   Modules accepted: Orders

## 2016-05-01 NOTE — Telephone Encounter (Signed)
Sent to the pharmacy by e-scribe for 3 months.  Pt has upcoming cpx with Dr. Sherren Mocha on 07/18/16

## 2016-05-03 DIAGNOSIS — H25013 Cortical age-related cataract, bilateral: Secondary | ICD-10-CM | POA: Diagnosis not present

## 2016-05-03 DIAGNOSIS — D3132 Benign neoplasm of left choroid: Secondary | ICD-10-CM | POA: Diagnosis not present

## 2016-05-03 DIAGNOSIS — H524 Presbyopia: Secondary | ICD-10-CM | POA: Diagnosis not present

## 2016-07-18 ENCOUNTER — Encounter: Payer: Self-pay | Admitting: Family Medicine

## 2016-07-18 ENCOUNTER — Ambulatory Visit (INDEPENDENT_AMBULATORY_CARE_PROVIDER_SITE_OTHER): Payer: Medicare Other | Admitting: Family Medicine

## 2016-07-18 VITALS — BP 126/80 | HR 81 | Temp 98.3°F | Ht 60.5 in | Wt 126.8 lb

## 2016-07-18 DIAGNOSIS — N952 Postmenopausal atrophic vaginitis: Secondary | ICD-10-CM | POA: Diagnosis not present

## 2016-07-18 DIAGNOSIS — F329 Major depressive disorder, single episode, unspecified: Secondary | ICD-10-CM

## 2016-07-18 DIAGNOSIS — I10 Essential (primary) hypertension: Secondary | ICD-10-CM

## 2016-07-18 DIAGNOSIS — F32A Depression, unspecified: Secondary | ICD-10-CM

## 2016-07-18 LAB — POCT URINALYSIS DIPSTICK
BILIRUBIN UA: NEGATIVE
Blood, UA: NEGATIVE
GLUCOSE UA: NEGATIVE
KETONES UA: NEGATIVE
Nitrite, UA: NEGATIVE
PROTEIN UA: NEGATIVE
SPEC GRAV UA: 1.015
Urobilinogen, UA: 0.2
pH, UA: 7.5

## 2016-07-18 LAB — HEPATIC FUNCTION PANEL
ALBUMIN: 4.3 g/dL (ref 3.5–5.2)
ALT: 15 U/L (ref 0–35)
AST: 18 U/L (ref 0–37)
Alkaline Phosphatase: 61 U/L (ref 39–117)
BILIRUBIN TOTAL: 0.6 mg/dL (ref 0.2–1.2)
Bilirubin, Direct: 0.1 mg/dL (ref 0.0–0.3)
Total Protein: 6.6 g/dL (ref 6.0–8.3)

## 2016-07-18 LAB — CBC WITH DIFFERENTIAL/PLATELET
BASOS PCT: 0.8 % (ref 0.0–3.0)
Basophils Absolute: 0 10*3/uL (ref 0.0–0.1)
EOS PCT: 2.3 % (ref 0.0–5.0)
Eosinophils Absolute: 0.1 10*3/uL (ref 0.0–0.7)
HEMATOCRIT: 38.5 % (ref 36.0–46.0)
HEMOGLOBIN: 13 g/dL (ref 12.0–15.0)
LYMPHS PCT: 38.4 % (ref 12.0–46.0)
Lymphs Abs: 1.9 10*3/uL (ref 0.7–4.0)
MCHC: 33.6 g/dL (ref 30.0–36.0)
MCV: 88 fl (ref 78.0–100.0)
MONO ABS: 0.4 10*3/uL (ref 0.1–1.0)
MONOS PCT: 8.7 % (ref 3.0–12.0)
Neutro Abs: 2.5 10*3/uL (ref 1.4–7.7)
Neutrophils Relative %: 49.8 % (ref 43.0–77.0)
Platelets: 358 10*3/uL (ref 150.0–400.0)
RBC: 4.38 Mil/uL (ref 3.87–5.11)
RDW: 13.1 % (ref 11.5–15.5)
WBC: 5.1 10*3/uL (ref 4.0–10.5)

## 2016-07-18 LAB — LIPID PANEL
CHOL/HDL RATIO: 4
Cholesterol: 244 mg/dL — ABNORMAL HIGH (ref 0–200)
HDL: 55.7 mg/dL (ref >39.00)
LDL Cholesterol: 166 mg/dL — ABNORMAL HIGH (ref 0–99)
NONHDL: 188.43
Triglycerides: 111 mg/dL (ref 0.0–149.0)
VLDL: 22.2 mg/dL (ref 0.0–40.0)

## 2016-07-18 LAB — BASIC METABOLIC PANEL
BUN: 13 mg/dL (ref 6–23)
CHLORIDE: 103 meq/L (ref 96–112)
CO2: 31 meq/L (ref 19–32)
CREATININE: 0.74 mg/dL (ref 0.40–1.20)
Calcium: 9.6 mg/dL (ref 8.4–10.5)
GFR: 82.56 mL/min (ref >60.00)
Glucose, Bld: 85 mg/dL (ref 70–99)
POTASSIUM: 4.2 meq/L (ref 3.5–5.1)
Sodium: 142 mEq/L (ref 135–145)

## 2016-07-18 LAB — TSH: TSH: 1.7 u[IU]/mL (ref 0.35–4.50)

## 2016-07-18 MED ORDER — TRIAMTERENE-HCTZ 37.5-25 MG PO CAPS: 1.0000 | ORAL_CAPSULE | Freq: Every day | ORAL | 4 refills | Status: DC

## 2016-07-18 MED ORDER — SERTRALINE HCL 50 MG PO TABS: 50.0000 mg | ORAL_TABLET | Freq: Every day | ORAL | 4 refills | Status: DC

## 2016-07-18 MED ORDER — ESTROGENS, CONJUGATED 0.625 MG/GM VA CREA: 1.0000 g | TOPICAL_CREAM | Freq: Every day | VAGINAL | 11 refills | Status: DC

## 2016-07-18 NOTE — Progress Notes (Signed)
Pre visit review using our clinic review tool, if applicable. No additional management support is needed unless otherwise documented below in the visit note. 

## 2016-07-18 NOTE — Patient Instructions (Signed)
Premarin vaginal cream.............. small amounts 2-3 times weekly....... [email or web address]  Continue the Zoloft 50 mg.......Marland Kitchen 1 at bedtime  Continue the Dyazide ......... one every morning  Follow-up in one year sooner if any problems

## 2016-07-18 NOTE — Progress Notes (Signed)
Hanley is a 70 year old married female nonsmoker who comes in today for general physical examination because of a history of hypertension mild depression  She takes Zoloft 50 mg daily because of history of mild depression. She's sleeping well doing well mood is good. Compliant with medication  She takes Dyazide 30 7. 5-25 daily for mild hypertension. BP 126/70. Compliant with medication  She takes Premarin cream 2-3 times weekly because of vaginal dryness  She gets routine eye care, dental care, BSE monthly, annual mammography, colonoscopy 2016 normal  Her last Pap smear here was in December 2014. In the fall 2015 she had some spotting. We referred her GYN. They did a complete workup that was negative. She's had no more spotting since that time.  Discussed indications for Pap smears every 3 years ordered a 65 cyst she is in the low risk category no more Pap smears.  Vaccinations up-to-date,, declines a flu shot  14 point review of systems reviewed are negative except for above  Physical evaluation.BP 126/80 (BP Location: Left Arm, Patient Position: Sitting, Cuff Size: Normal)   Pulse 81   Temp 98.3 F (36.8 C) (Oral)   Ht 5' 0.5" (1.537 m)   Wt 126 lb 12.8 oz (57.5 kg)   SpO2 97%   BMI 24.36 kg/m  Examination of the HEENT were negative neck was supple no adenopathy thyroid normal cardiopulmonary exam normal breast exam normal abdominal exam normal pelvic and rectal deferred extremities normal skin no peripheral pulses normal  Impression #1 hypertension at goal........ continue Dyazide  #2 mild depression....... continue Zoloft 50 mg daily. #3 postmenopausal vaginal dryness........ Premarin cream

## 2016-08-29 ENCOUNTER — Telehealth: Payer: Self-pay | Admitting: Family Medicine

## 2016-08-29 NOTE — Telephone Encounter (Signed)
Out reach for awv - just had cpe - call back in June

## 2016-09-24 ENCOUNTER — Encounter: Payer: Self-pay | Admitting: Family Medicine

## 2016-09-24 ENCOUNTER — Ambulatory Visit (INDEPENDENT_AMBULATORY_CARE_PROVIDER_SITE_OTHER): Payer: Medicare Other | Admitting: Family Medicine

## 2016-09-24 VITALS — BP 124/80 | HR 75 | Temp 97.9°F | Ht 60.0 in | Wt 123.4 lb

## 2016-09-24 DIAGNOSIS — N6002 Solitary cyst of left breast: Secondary | ICD-10-CM

## 2016-09-24 NOTE — Patient Instructions (Signed)
Continue to do a thorough breast exam but because of that cyst 9:00 left breast...Marland KitchenMarland KitchenMarland Kitchen I would check it weekly. If you don't see any increase in size or changes then we'll do year regular mammogram in June. However if you since its getting larger if you have any questions let us know and we will do an ultrasound

## 2016-09-24 NOTE — Progress Notes (Signed)
Sally Shea is a 71 year old married female nonsmoker who comes in today for evaluation of a cat scratch  The first week in December her cat scratched her left breast. 3 weeks ago she knows some erythema. She cleaned it out and it seemed to be okay until 4 days ago when she knows some enlarged lymph nodes in her left armpit.  No fever.  BP 124/80 (BP Location: Left Arm, Patient Position: Sitting, Cuff Size: Normal)   Pulse 75   Temp 97.9 F (36.6 C) (Oral)   Ht 5' (1.524 m)   Wt 123 lb 6.4 oz (56 kg)   BMI 24.10 kg/m  Examination of the left breast in the supine position shows no evidence of any skin changes. There is some tenderness at 9:00 2 inches from the nipple. There is a soft movable tender cystic type lesion. Examination of the axillas normal no lymphadenopathy  Impression benign breast cyst........Marland Kitchen plan check monthly....... follow-up mammogram June 2018. Return sooner if any problems

## 2017-03-29 ENCOUNTER — Other Ambulatory Visit: Payer: Self-pay | Admitting: Family Medicine

## 2017-03-29 DIAGNOSIS — Z1231 Encounter for screening mammogram for malignant neoplasm of breast: Secondary | ICD-10-CM

## 2017-04-01 ENCOUNTER — Ambulatory Visit
Admission: RE | Admit: 2017-04-01 | Discharge: 2017-04-01 | Disposition: A | Discharge status: Home / Self Care | Payer: Medicare Other | Source: Ambulatory Visit | Attending: Family Medicine | Admitting: Family Medicine

## 2017-04-01 DIAGNOSIS — Z1231 Encounter for screening mammogram for malignant neoplasm of breast: Secondary | ICD-10-CM

## 2017-05-07 ENCOUNTER — Telehealth: Payer: Self-pay | Admitting: Pediatrics

## 2017-05-07 DIAGNOSIS — F329 Major depressive disorder, single episode, unspecified: Secondary | ICD-10-CM

## 2017-05-07 MED ORDER — SERTRALINE HCL 50 MG PO TABS: 50.0000 mg | ORAL_TABLET | Freq: Every day | ORAL | 0 refills | Status: DC

## 2017-05-07 NOTE — Telephone Encounter (Signed)
[facility] sent fax requesting (per patient request) sertraline be written for 90 day supply. I re-wrote rx to reflect such.

## 2017-07-02 ENCOUNTER — Other Ambulatory Visit: Payer: Self-pay | Admitting: Family Medicine

## 2017-07-02 DIAGNOSIS — N644 Mastodynia: Secondary | ICD-10-CM | POA: Insufficient documentation

## 2017-07-08 ENCOUNTER — Other Ambulatory Visit: Payer: Self-pay | Admitting: Family Medicine

## 2017-07-08 DIAGNOSIS — N644 Mastodynia: Secondary | ICD-10-CM

## 2017-07-10 ENCOUNTER — Ambulatory Visit
Admission: RE | Admit: 2017-07-10 | Discharge: 2017-07-10 | Disposition: A | Discharge status: Home / Self Care | Payer: Medicare Other | Source: Ambulatory Visit | Attending: Family Medicine | Admitting: Family Medicine

## 2017-07-10 DIAGNOSIS — N644 Mastodynia: Secondary | ICD-10-CM

## 2017-07-11 ENCOUNTER — Other Ambulatory Visit: Payer: Medicare Other

## 2017-07-24 ENCOUNTER — Encounter: Payer: Self-pay | Admitting: Family Medicine

## 2017-07-24 ENCOUNTER — Ambulatory Visit (INDEPENDENT_AMBULATORY_CARE_PROVIDER_SITE_OTHER): Payer: Medicare Other | Admitting: Family Medicine

## 2017-07-24 ENCOUNTER — Other Ambulatory Visit: Payer: Self-pay

## 2017-07-24 VITALS — BP 108/80 | HR 84 | Temp 98.1°F | Ht 60.0 in | Wt 125.1 lb

## 2017-07-24 DIAGNOSIS — N952 Postmenopausal atrophic vaginitis: Secondary | ICD-10-CM

## 2017-07-24 DIAGNOSIS — F329 Major depressive disorder, single episode, unspecified: Secondary | ICD-10-CM

## 2017-07-24 DIAGNOSIS — I10 Essential (primary) hypertension: Secondary | ICD-10-CM | POA: Diagnosis not present

## 2017-07-24 DIAGNOSIS — Z1159 Encounter for screening for other viral diseases: Secondary | ICD-10-CM | POA: Diagnosis not present

## 2017-07-24 DIAGNOSIS — Z0000 Encounter for general adult medical examination without abnormal findings: Secondary | ICD-10-CM

## 2017-07-24 DIAGNOSIS — Z23 Encounter for immunization: Secondary | ICD-10-CM

## 2017-07-24 DIAGNOSIS — N644 Mastodynia: Secondary | ICD-10-CM | POA: Diagnosis not present

## 2017-07-24 DIAGNOSIS — F32A Depression, unspecified: Secondary | ICD-10-CM

## 2017-07-24 LAB — BASIC METABOLIC PANEL
BUN: 18 mg/dL (ref 6–23)
CALCIUM: 9.5 mg/dL (ref 8.4–10.5)
CO2: 32 mEq/L (ref 19–32)
CREATININE: 0.76 mg/dL (ref 0.40–1.20)
Chloride: 101 mEq/L (ref 96–112)
GFR: 79.82 mL/min (ref >60.00)
Glucose, Bld: 82 mg/dL (ref 70–99)
Potassium: 3.9 mEq/L (ref 3.5–5.1)
Sodium: 139 mEq/L (ref 135–145)

## 2017-07-24 LAB — CBC WITH DIFFERENTIAL/PLATELET
BASOS PCT: 1.3 % (ref 0.0–3.0)
Basophils Absolute: 0.1 10*3/uL (ref 0.0–0.1)
Eosinophils Absolute: 0.1 10*3/uL (ref 0.0–0.7)
Eosinophils Relative: 2.3 % (ref 0.0–5.0)
HEMATOCRIT: 38.3 % (ref 36.0–46.0)
Hemoglobin: 13 g/dL (ref 12.0–15.0)
Lymphocytes Relative: 33.7 % (ref 12.0–46.0)
Lymphs Abs: 1.8 10*3/uL (ref 0.7–4.0)
MCHC: 33.9 g/dL (ref 30.0–36.0)
MCV: 91.2 fl (ref 78.0–100.0)
MONOS PCT: 8.8 % (ref 3.0–12.0)
Monocytes Absolute: 0.5 10*3/uL (ref 0.1–1.0)
NEUTROS ABS: 2.9 10*3/uL (ref 1.4–7.7)
Neutrophils Relative %: 53.9 % (ref 43.0–77.0)
PLATELETS: 352 10*3/uL (ref 150.0–400.0)
RBC: 4.2 Mil/uL (ref 3.87–5.11)
RDW: 12.8 % (ref 11.5–15.5)
WBC: 5.4 10*3/uL (ref 4.0–10.5)

## 2017-07-24 LAB — HEPATIC FUNCTION PANEL
ALK PHOS: 53 U/L (ref 39–117)
ALT: 14 U/L (ref 0–35)
AST: 17 U/L (ref 0–37)
Albumin: 4.2 g/dL (ref 3.5–5.2)
BILIRUBIN DIRECT: 0.1 mg/dL (ref 0.0–0.3)
BILIRUBIN TOTAL: 0.6 mg/dL (ref 0.2–1.2)
Total Protein: 6.3 g/dL (ref 6.0–8.3)

## 2017-07-24 LAB — LIPID PANEL
CHOL/HDL RATIO: 4
Cholesterol: 232 mg/dL — ABNORMAL HIGH (ref 0–200)
HDL: 51.7 mg/dL (ref >39.00)
LDL CALC: 155 mg/dL — AB (ref 0–99)
NonHDL: 179.94
Triglycerides: 124 mg/dL (ref 0.0–149.0)
VLDL: 24.8 mg/dL (ref 0.0–40.0)

## 2017-07-24 LAB — POCT URINALYSIS DIPSTICK
BILIRUBIN UA: NEGATIVE
GLUCOSE UA: NEGATIVE
KETONES UA: NEGATIVE
NITRITE UA: NEGATIVE
PH UA: 6.5 (ref 5.0–8.0)
PROTEIN UA: NEGATIVE
RBC UA: NEGATIVE
SPEC GRAV UA: 1.015 (ref 1.010–1.025)
Urobilinogen, UA: 0.2 E.U./dL

## 2017-07-24 LAB — TSH: TSH: 2.4 u[IU]/mL (ref 0.35–4.50)

## 2017-07-24 MED ORDER — ESTROGENS, CONJUGATED 0.625 MG/GM VA CREA: 1.0000 g | TOPICAL_CREAM | Freq: Every day | VAGINAL | 11 refills | Status: DC

## 2017-07-24 MED ORDER — TRIAMTERENE-HCTZ 37.5-25 MG PO CAPS: 1.0000 | ORAL_CAPSULE | Freq: Every day | ORAL | 4 refills | Status: DC

## 2017-07-24 MED ORDER — SERTRALINE HCL 50 MG PO TABS: 50.0000 mg | ORAL_TABLET | Freq: Every day | ORAL | 4 refills | Status: DC

## 2017-07-24 NOTE — Patient Instructions (Signed)
Continue current medications diet and exercise  Follow-up in one year sooner if any problems  If you have any further uterine bleeding O encourage you to go back and see her GYN but insist that they do any further biopsies with sedation  Labs today.......... I will call you if there is anything abnormal.

## 2017-07-24 NOTE — Progress Notes (Signed)
Sally Shea is a 71 year old married female nonsmoker G2 P2 LMP 3 months ago who comes in today for evaluation of hypertension  She's had a history of dysfunctional uterine bleeding for many years. Her last GYN evaluation was 3 months ago. Biopsy was negative. She was asked to come back for rebiopsy if she had any further bleeding. She's reluctant to do that because she had severe pain from the procedure.  I encouraged her to go back for rebiopsy she has any further bleeding because of the remote chance of uterine cancer. Advised her to tell them she needs to be sedated in the future.  She gets routine eye care, dental care, BSE monthly, annual mammography........Marland Kitchen recently did an ultrasound on a her left breast. She had a sore area your ultrasound shows a fibrocystic lesion it's actually been present there for many years. Hasn't changed in size.. Last colonoscopy 2016 normal  Vaccinations seasonal flu shot given today information given on the new shingles vaccine.  She takes Zoloft 50 mg daily at bedtime because of a history of mild depression. She also uses Premarin vaginal cream once or twice weekly for vaginal dryness  She takes Maxide 1 daily because of a history of mild hypertension. BP today 120/70 which is what it is at home.  14 point review of systems June otherwise negative  BP 108/80 (BP Location: Left Arm, Patient Position: Sitting, Cuff Size: Normal)   Pulse 84   Temp 98.1 F (36.7 C) (Oral)   Ht 5' (1.524 m)   Wt 125 lb 1.6 oz (56.7 kg)   BMI 24.43 kg/m  Examination HEENT was negative. Neck was supple thyroid is not enlarged no carotid bruits. Cardiopulmonary exam normal breast exam normal except for tenderness left breast 9:00 position about 2 inches from the nipple. This is a lesion has been present there before. Soft rubbery and movable and tender. As noted previously we had an ultrasound that area because it was sore. Ultrasound was normal. Abdominal exam negative pelvic rectal  done by GYN therefore deferred extremities normal skin or peripheral pulses normal  #1 healthy female  #2 hypertension at goal.......... continue current therapy  #3 perimenopausal with dry vagina..... And still having occasional DU B........Marland Kitchen encouraged her to go back and see her GYN if any further bleeding but have the biopsy done under conscious sedation.  #4 history of mild depression.....Marland Kitchen continue Zoloft.

## 2017-07-25 LAB — HEPATITIS C ANTIBODY
HEP C AB: NONREACTIVE
SIGNAL TO CUT-OFF: 0.01 (ref <1.00)

## 2017-09-25 ENCOUNTER — Ambulatory Visit: Payer: Medicare Other | Admitting: Family Medicine

## 2017-09-25 ENCOUNTER — Encounter: Payer: Self-pay | Admitting: Family Medicine

## 2017-09-25 VITALS — BP 128/82 | HR 90 | Wt 125.0 lb

## 2017-09-25 DIAGNOSIS — N3001 Acute cystitis with hematuria: Secondary | ICD-10-CM

## 2017-09-25 DIAGNOSIS — R3915 Urgency of urination: Secondary | ICD-10-CM

## 2017-09-25 LAB — POC URINALSYSI DIPSTICK (AUTOMATED)
BILIRUBIN UA: NEGATIVE
GLUCOSE UA: NEGATIVE
Ketones, UA: NEGATIVE
NITRITE UA: NEGATIVE
SPEC GRAV UA: 1.015 (ref 1.010–1.025)
Urobilinogen, UA: 0.2 E.U./dL
pH, UA: 6 (ref 5.0–8.0)

## 2017-09-25 MED ORDER — AMOXICILLIN 875 MG PO TABS: 875.0000 mg | ORAL_TABLET | Freq: Two times a day (BID) | ORAL | 1 refills | Status: DC

## 2017-09-25 NOTE — Progress Notes (Signed)
Sally Shea is a 72 year old married female nonsmoker who comes in today because of frequency and dysuria for 4 days  She states last Friday and Saturday she did a lot of driving didn't empty her bladder very much. Then on Sunday she began having urinary frequency. Last night she noticed blood in her urine. No fever chills or back pain. Her last urinary tract infection was 40 years ago when she had her last child  BP 128/82 (BP Location: Left Arm, Patient Position: Sitting, Cuff Size: Normal)   Pulse 90   Wt 125 lb (56.7 kg)   BMI 24.41 kg/m  Well-developed well-nourished female no acute distress vital signs stable she's afebrile  Urinalysis shows 3+ blood specific gravity 10:15 1+ protein 2+ white cells negative nitrate  #1 urinary tract infection........... culture urine....... started on ATB  follow-up next week when urine culture returned  The computer says she is allergic to sulfamethoxazole however she does not recall having allergic reaction any medication. However we'll pick another drug just to be sure

## 2017-09-25 NOTE — Patient Instructions (Signed)
Amoxicillin 875........... one twice daily  Drink lots of water  OTC Pyridium........... 3 times daily  We will culture her urine today and I will call you the report next week

## 2017-09-30 LAB — URINE CULTURE
MICRO NUMBER:: 90102524
SPECIMEN QUALITY:: ADEQUATE

## 2017-10-01 ENCOUNTER — Other Ambulatory Visit: Payer: Self-pay | Admitting: Family Medicine

## 2017-12-11 ENCOUNTER — Other Ambulatory Visit: Payer: Self-pay | Admitting: Family Medicine

## 2017-12-11 MED ORDER — SULFAMETHOXAZOLE-TRIMETHOPRIM 800-160 MG PO TABS: 1.0000 | ORAL_TABLET | Freq: Two times a day (BID) | ORAL | 1 refills | Status: DC

## 2018-01-23 DIAGNOSIS — H5789 Other specified disorders of eye and adnexa: Secondary | ICD-10-CM | POA: Diagnosis not present

## 2018-01-23 DIAGNOSIS — B309 Viral conjunctivitis, unspecified: Secondary | ICD-10-CM | POA: Diagnosis not present

## 2018-03-24 ENCOUNTER — Other Ambulatory Visit: Payer: Self-pay | Admitting: Family Medicine

## 2018-03-24 DIAGNOSIS — Z1231 Encounter for screening mammogram for malignant neoplasm of breast: Secondary | ICD-10-CM

## 2018-05-16 ENCOUNTER — Ambulatory Visit
Admission: RE | Admit: 2018-05-16 | Discharge: 2018-05-16 | Disposition: A | Discharge status: Home / Self Care | Payer: Medicare Other | Source: Ambulatory Visit | Attending: Family Medicine | Admitting: Family Medicine

## 2018-05-16 ENCOUNTER — Ambulatory Visit: Payer: Medicare Other

## 2018-05-16 DIAGNOSIS — Z1231 Encounter for screening mammogram for malignant neoplasm of breast: Secondary | ICD-10-CM

## 2018-05-20 ENCOUNTER — Ambulatory Visit: Payer: Medicare Other

## 2018-07-15 ENCOUNTER — Ambulatory Visit (INDEPENDENT_AMBULATORY_CARE_PROVIDER_SITE_OTHER): Payer: Medicare Other | Admitting: Family Medicine

## 2018-07-15 ENCOUNTER — Encounter: Payer: Self-pay | Admitting: Family Medicine

## 2018-07-15 VITALS — BP 130/80 | Temp 98.3°F | Ht 60.0 in | Wt 127.9 lb

## 2018-07-15 DIAGNOSIS — Z23 Encounter for immunization: Secondary | ICD-10-CM

## 2018-07-15 DIAGNOSIS — Z136 Encounter for screening for cardiovascular disorders: Secondary | ICD-10-CM

## 2018-07-15 DIAGNOSIS — F32A Depression, unspecified: Secondary | ICD-10-CM

## 2018-07-15 DIAGNOSIS — N952 Postmenopausal atrophic vaginitis: Secondary | ICD-10-CM | POA: Diagnosis not present

## 2018-07-15 DIAGNOSIS — F329 Major depressive disorder, single episode, unspecified: Secondary | ICD-10-CM | POA: Diagnosis not present

## 2018-07-15 DIAGNOSIS — I10 Essential (primary) hypertension: Secondary | ICD-10-CM | POA: Diagnosis not present

## 2018-07-15 LAB — POCT URINALYSIS DIPSTICK
Bilirubin, UA: NEGATIVE
Glucose, UA: NEGATIVE
KETONES UA: NEGATIVE
Leukocytes, UA: NEGATIVE
NITRITE UA: NEGATIVE
Odor: NEGATIVE
PH UA: 6 (ref 5.0–8.0)
Protein, UA: NEGATIVE
RBC UA: NEGATIVE
SPEC GRAV UA: 1.02 (ref 1.010–1.025)
UROBILINOGEN UA: 0.2 U/dL

## 2018-07-15 LAB — HEPATIC FUNCTION PANEL
ALBUMIN: 4.5 g/dL (ref 3.5–5.2)
ALK PHOS: 64 U/L (ref 39–117)
ALT: 18 U/L (ref 0–35)
AST: 21 U/L (ref 0–37)
Bilirubin, Direct: 0.1 mg/dL (ref 0.0–0.3)
TOTAL PROTEIN: 6.9 g/dL (ref 6.0–8.3)
Total Bilirubin: 0.5 mg/dL (ref 0.2–1.2)

## 2018-07-15 LAB — BASIC METABOLIC PANEL
BUN: 19 mg/dL (ref 6–23)
CALCIUM: 9.5 mg/dL (ref 8.4–10.5)
CHLORIDE: 100 meq/L (ref 96–112)
CO2: 29 mEq/L (ref 19–32)
CREATININE: 0.79 mg/dL (ref 0.40–1.20)
GFR: 76.12 mL/min (ref >60.00)
Glucose, Bld: 79 mg/dL (ref 70–99)
Potassium: 3.5 mEq/L (ref 3.5–5.1)
Sodium: 139 mEq/L (ref 135–145)

## 2018-07-15 LAB — LIPID PANEL
Cholesterol: 235 mg/dL — ABNORMAL HIGH (ref 0–200)
HDL: 56.2 mg/dL (ref >39.00)
LDL Cholesterol: 150 mg/dL — ABNORMAL HIGH (ref 0–99)
NonHDL: 178.45
TRIGLYCERIDES: 141 mg/dL (ref 0.0–149.0)
Total CHOL/HDL Ratio: 4
VLDL: 28.2 mg/dL (ref 0.0–40.0)

## 2018-07-15 LAB — CBC WITH DIFFERENTIAL/PLATELET
BASOS ABS: 0.1 10*3/uL (ref 0.0–0.1)
Basophils Relative: 0.8 % (ref 0.0–3.0)
EOS ABS: 0.1 10*3/uL (ref 0.0–0.7)
Eosinophils Relative: 1.3 % (ref 0.0–5.0)
HCT: 39.2 % (ref 36.0–46.0)
Hemoglobin: 13.4 g/dL (ref 12.0–15.0)
LYMPHS ABS: 2.3 10*3/uL (ref 0.7–4.0)
Lymphocytes Relative: 31.7 % (ref 12.0–46.0)
MCHC: 34.1 g/dL (ref 30.0–36.0)
MCV: 90.1 fl (ref 78.0–100.0)
Monocytes Absolute: 0.5 10*3/uL (ref 0.1–1.0)
Monocytes Relative: 6.9 % (ref 3.0–12.0)
NEUTROS ABS: 4.3 10*3/uL (ref 1.4–7.7)
NEUTROS PCT: 59.3 % (ref 43.0–77.0)
PLATELETS: 374 10*3/uL (ref 150.0–400.0)
RBC: 4.35 Mil/uL (ref 3.87–5.11)
RDW: 12.6 % (ref 11.5–15.5)
WBC: 7.3 10*3/uL (ref 4.0–10.5)

## 2018-07-15 LAB — TSH: TSH: 2.57 u[IU]/mL (ref 0.35–4.50)

## 2018-07-15 MED ORDER — TRIAMTERENE-HCTZ 37.5-25 MG PO CAPS: 1.0000 | ORAL_CAPSULE | Freq: Every day | ORAL | 4 refills | Status: DC

## 2018-07-15 MED ORDER — AMOXICILLIN 875 MG PO TABS: 875.0000 mg | ORAL_TABLET | Freq: Two times a day (BID) | ORAL | 1 refills | Status: DC

## 2018-07-15 MED ORDER — SERTRALINE HCL 50 MG PO TABS: 50.0000 mg | ORAL_TABLET | Freq: Every day | ORAL | 4 refills | Status: DC

## 2018-07-15 MED ORDER — ESTROGENS, CONJUGATED 0.625 MG/GM VA CREA: 1.0000 g | TOPICAL_CREAM | Freq: Every day | VAGINAL | 11 refills | Status: DC

## 2018-07-15 NOTE — Progress Notes (Signed)
Sally Shea is a delightful 72 year old married female non-smoker comes in today for evaluation of the following issues  She has a history of recurrent urinary tract infections.  She underwent a complete urologic work-up which was negative.  She is had 6 UTIs in the past 12 months.  The last one was about a month ago she took 3 days of amoxicillin and it went away.  In the past she has had severe vaginal dryness.  She was on estrogen cream but stopped it.  This may be a reason why she is having recurrent UTIs  She takes Zoloft 50 mg at bedtime because of a history of mild depression  She takes Dyazide 1 tablet daily because a history of mild hypertension.  BP 130/80  Vaccinations up-to-date information given on the new shingles vaccine  Cognitive function normal she walks daily home health safety reviewed no issues identified, no guns in the house, she does have a healthcare power of attorney and living well  She had a Pap smear last year by GYN was normal.  We sent her there initially because she had some postmenopausal spotting.  Fortunately her biopsies of all been negative.  No recurrence of her spotting.  EKG was done because of a history of hypertension.  EKG was normal and unchanged Cognitive function normal she walks daily home health safety reviewed no issues identified, no guns in the house, she does have a healthcare power of attorney and living well  14 point review of system reviewed otherwise negative  BP 130/80 (BP Location: Right Arm, Patient Position: Sitting, Cuff Size: Normal)   Temp 98.3 F (36.8 C) (Oral)   Ht 5' (1.524 m)   Wt 127 lb 14.4 oz (58 kg)   BMI 24.98 kg/m  Well-developed well-nourished female no acute distress vital signs stable she is afebrile examination HEENT were negative neck was supple thyroid is not enlarged no carotid bruits.  Cardiopulmonary exam normal.  Breast exam normal.  Abdominal exam normal.  Pelvic and rectal by GYN therefore deferred.  Extremities  normal skin normal peripheral pulses normal except for lesion on right lateral knee consistent with an AK.  Advised to see her dermatologist to get it removed  1.  Healthy female  2.  Hypertension at goal........ continue current therapy  3.  Depression........ continue Zoloft  4.  Abnormal appearing lesion lateral to the right knee.......Marland Kitchen referred to dermatology  5.  Postmenopausal vaginal dryness with recurrent UTIs and a normal urologic work-up.......Marland Kitchen recommend restarting the Premarin vaginal cream

## 2018-07-15 NOTE — Patient Instructions (Signed)
Labs today......... I will call if there is anything abnormal  Restart the Premarin cream..........Marland Kitchen 1 applicatorful nightly for 2 weeks........ then 1 applicatorful nightly Monday Wednesday Friday  Remember to drink lots of water  If you do develop UTI take the Amoxil as you have done in the past  Call your insurance company to find out where you get the new shingles vaccine

## 2018-07-28 ENCOUNTER — Encounter: Payer: Medicare Other | Admitting: Family Medicine

## 2018-08-20 ENCOUNTER — Other Ambulatory Visit: Payer: Self-pay | Admitting: *Deleted

## 2018-08-20 DIAGNOSIS — F329 Major depressive disorder, single episode, unspecified: Secondary | ICD-10-CM

## 2018-08-20 MED ORDER — SERTRALINE HCL 50 MG PO TABS: 50.0000 mg | ORAL_TABLET | Freq: Every day | ORAL | 0 refills | Status: DC

## 2018-10-15 ENCOUNTER — Ambulatory Visit: Payer: Medicare Other | Admitting: Adult Health

## 2018-10-15 ENCOUNTER — Encounter: Payer: Self-pay | Admitting: Adult Health

## 2018-10-15 VITALS — BP 112/62 | Temp 99.2°F | Wt 124.0 lb

## 2018-10-15 DIAGNOSIS — I10 Essential (primary) hypertension: Secondary | ICD-10-CM | POA: Diagnosis not present

## 2018-10-15 DIAGNOSIS — Z7689 Persons encountering health services in other specified circumstances: Secondary | ICD-10-CM | POA: Diagnosis not present

## 2018-10-15 DIAGNOSIS — F329 Major depressive disorder, single episode, unspecified: Secondary | ICD-10-CM

## 2018-10-15 DIAGNOSIS — F32A Depression, unspecified: Secondary | ICD-10-CM

## 2018-10-15 NOTE — Progress Notes (Signed)
Patient presents to clinic today to establish care. Pleasant 73 year old female who  has a past medical history of Allergy, Anxiety, Blood transfusion without reported diagnosis, Degenerative cervical disc, Depression, Headache(784.0), and Hypertension.  Her last CPE was in 07/2018   Acute Concerns: Establish Care   Chronic Issues: Essential Hypertension - Takes Dyazide 37.5-25 mg  BP Readings from Last 3 Encounters:  10/15/18 112/62  07/15/18 130/80  09/25/17 128/82   Depression/Anxiety - Controlled with Zoloft 50 mg   Frequent UTI - has had a complete urological workup in the recent past which was negative.   Health Maintenance: Dental -- Routine  Vision -- Routine  Immunizations -- UTD Colonoscopy -- UTD Mammogram --UTD PAP -- UTD Bone Density -- UTD  Diet: Eats healthy.  Exercise: Routinely walks    Past Medical History:  Diagnosis Date  . Allergy    seasonal  . Anxiety   . Blood transfusion without reported diagnosis    years ago  . Degenerative cervical disc   . Depression   . Headache(784.0)   . Hypertension     Past Surgical History:  Procedure Laterality Date  . COLONOSCOPY    . EYE SURGERY    . TUBAL LIGATION      Current Outpatient Medications on File Prior to Visit  Medication Sig Dispense Refill  . aspirin 81 MG tablet Take 81 mg by mouth daily.      . calcium citrate-vitamin D (CITRACAL+D) 315-200 MG-UNIT per tablet Take 1 tablet by mouth daily.      Marland Kitchen conjugated estrogens (PREMARIN) vaginal cream Place 0.5 Applicatorfuls vaginally daily. 42.5 g 11  . fish oil-omega-3 fatty acids 1000 MG capsule Take 1 g by mouth daily.      . sertraline (ZOLOFT) 50 MG tablet Take 1 tablet (50 mg total) by mouth daily. 90 tablet 0  . triamterene-hydrochlorothiazide (DYAZIDE) 37.5-25 MG capsule Take 1 each (1 capsule total) by mouth daily. 100 capsule 4  . amoxicillin (AMOXIL) 875 MG tablet Take 1 tablet (875 mg total) by mouth 2 (two) times daily. (Patient  not taking: Reported on 10/15/2018) 20 tablet 1   No current facility-administered medications on file prior to visit.     Allergies  Allergen Reactions  . Sulfamethoxazole     REACTION: thrush    Family History  Problem Relation Age of Onset  . Heart disease Mother   . Alcohol abuse Other   . Anxiety disorder Other   . Depression Other   . Hyperlipidemia Other   . Hypertension Other   . Colon cancer Neg Hx     Social History   Socioeconomic History  . Marital status: Married    Spouse name: Not on file  . Number of children: Not on file  . Years of education: Not on file  . Highest education level: Not on file  Occupational History  . Not on file  Social Needs  . Financial resource strain: Not on file  . Food insecurity:    Worry: Not on file    Inability: Not on file  . Transportation needs:    Medical: Not on file    Non-medical: Not on file  Tobacco Use  . Smoking status: Never Smoker  . Smokeless tobacco: Never Used  Substance and Sexual Activity  . Alcohol use: No  . Drug use: No  . Sexual activity: Not on file  Lifestyle  . Physical activity:    Days per week: Not on  file    Minutes per session: Not on file  . Stress: Not on file  Relationships  . Social connections:    Talks on phone: Not on file    Gets together: Not on file    Attends religious service: Not on file    Active member of club or organization: Not on file    Attends meetings of clubs or organizations: Not on file    Relationship status: Not on file  . Intimate partner violence:    Fear of current or ex partner: Not on file    Emotionally abused: Not on file    Physically abused: Not on file    Forced sexual activity: Not on file  Other Topics Concern  . Not on file  Social History Narrative  . Not on file    Review of Systems  Constitutional: Negative.   HENT: Negative.   Eyes: Negative.   Respiratory: Negative.   Cardiovascular: Negative.   Gastrointestinal: Negative.    Genitourinary: Negative.   Musculoskeletal: Negative.   Skin: Negative.   Neurological: Negative.   Endo/Heme/Allergies: Negative.   Psychiatric/Behavioral: Negative.   All other systems reviewed and are negative.    BP 112/62   Temp 99.2 F (37.3 C)   Wt 124 lb (56.2 kg)   BMI 24.22 kg/m   Physical Exam Vitals signs and nursing note reviewed.  Constitutional:      Appearance: Normal appearance.  Cardiovascular:     Rate and Rhythm: Normal rate and regular rhythm.     Pulses: Normal pulses.     Heart sounds: Normal heart sounds.  Pulmonary:     Effort: Pulmonary effort is normal.     Breath sounds: Normal breath sounds.  Skin:    General: Skin is warm and dry.     Capillary Refill: Capillary refill takes less than 2 seconds.  Neurological:     General: No focal deficit present.     Mental Status: She is alert and oriented to person, place, and time.  Psychiatric:        Mood and Affect: Mood normal.        Behavior: Behavior normal.        Thought Content: Thought content normal.        Judgment: Judgment normal.      Assessment/Plan: 1. Encounter to establish care - Follow up in November for CPE or sooner if needed - Continue healthy lifestyle   2. Essential hypertension - well controlled.  - No change in medications   3. Depression, unspecified depression type - Well controlled.  No change in medications   Dorothyann Peng, NP

## 2018-11-19 ENCOUNTER — Telehealth: Payer: Self-pay | Admitting: Adult Health

## 2018-11-19 ENCOUNTER — Ambulatory Visit: Payer: Medicare Other | Admitting: Adult Health

## 2018-11-19 ENCOUNTER — Encounter: Payer: Self-pay | Admitting: Adult Health

## 2018-11-19 VITALS — BP 120/76 | Temp 98.3°F | Wt 124.0 lb

## 2018-11-19 DIAGNOSIS — J302 Other seasonal allergic rhinitis: Secondary | ICD-10-CM

## 2018-11-19 DIAGNOSIS — M19042 Primary osteoarthritis, left hand: Secondary | ICD-10-CM | POA: Diagnosis not present

## 2018-11-19 MED ORDER — FLUTICASONE PROPIONATE 50 MCG/ACT NA SUSP: 2.0000 | Freq: Every day | NASAL | 6 refills | Status: DC

## 2018-11-19 MED ORDER — METHYLPREDNISOLONE 4 MG PO TBPK: ORAL_TABLET | ORAL | 0 refills | Status: DC

## 2018-11-19 NOTE — Progress Notes (Signed)
Subjective:    Patient ID: Sally Shea, female    DOB: 18-Mar-1946, 73 y.o.   MRN: [id number]  HPI 73 year old female who  has a past medical history of Allergy, Anxiety, Blood transfusion without reported diagnosis, Degenerative cervical disc, Depression, Headache(784.0), and Hypertension.  She presents to the office today for multiple acute complaints.  1.  Concern for sinus infection.  Reports that her symptoms started 5 days ago.  Symptoms include that of sore throat, yellow nasal drainage, congestion.  She denies sinus pain or pressure, fevers, chills, or feeling acutely ill.  She has been working in the garden more extensively over the last week.  Does not use anything over-the-counter to help with her symptoms.  2.  Left hand pain.  Reports with the change in weather she has had more pain in her left thumb.  She is wondering if she needs to get a referral to hand surgery due to the history of osteoarthritis.  She would rather not go through the surgery at this point in time but she is wondering what other options are available.  Denies redness warmth.  Has had some swelling and tenderness with movement.  Review of Systems See HPI   Past Medical History:  Diagnosis Date  . Allergy    seasonal  . Anxiety   . Blood transfusion without reported diagnosis    years ago  . Degenerative cervical disc   . Depression   . Headache(784.0)   . Hypertension     Social History   Socioeconomic History  . Marital status: Married    Spouse name: Not on file  . Number of children: Not on file  . Years of education: Not on file  . Highest education level: Not on file  Occupational History  . Not on file  Social Needs  . Financial resource strain: Not on file  . Food insecurity:    Worry: Not on file    Inability: Not on file  . Transportation needs:    Medical: Not on file    Non-medical: Not on file  Tobacco Use  . Smoking status: Never Smoker  . Smokeless tobacco: Never Used   Substance and Sexual Activity  . Alcohol use: No  . Drug use: No  . Sexual activity: Not on file  Lifestyle  . Physical activity:    Days per week: Not on file    Minutes per session: Not on file  . Stress: Not on file  Relationships  . Social connections:    Talks on phone: Not on file    Gets together: Not on file    Attends religious service: Not on file    Active member of club or organization: Not on file    Attends meetings of clubs or organizations: Not on file    Relationship status: Not on file  . Intimate partner violence:    Fear of current or ex partner: Not on file    Emotionally abused: Not on file    Physically abused: Not on file    Forced sexual activity: Not on file  Other Topics Concern  . Not on file  Social History Narrative  . Not on file    Past Surgical History:  Procedure Laterality Date  . COLONOSCOPY    . EYE SURGERY    . TUBAL LIGATION      Family History  Problem Relation Age of Onset  . Heart disease Mother   . Alcohol abuse  Other   . Anxiety disorder Other   . Depression Other   . Hyperlipidemia Other   . Hypertension Other   . Colon cancer Neg Hx     Allergies  Allergen Reactions  . Sulfamethoxazole     REACTION: thrush    Current Outpatient Medications on File Prior to Visit  Medication Sig Dispense Refill  . aspirin 81 MG tablet Take 81 mg by mouth daily.      . calcium citrate-vitamin D (CITRACAL+D) 315-200 MG-UNIT per tablet Take 1 tablet by mouth daily.      Marland Kitchen conjugated estrogens (PREMARIN) vaginal cream Place 0.5 Applicatorfuls vaginally daily. 42.5 g 11  . fish oil-omega-3 fatty acids 1000 MG capsule Take 1 g by mouth daily.      Marland Kitchen triamterene-hydrochlorothiazide (DYAZIDE) 37.5-25 MG capsule Take 1 each (1 capsule total) by mouth daily. 100 capsule 4  . sertraline (ZOLOFT) 50 MG tablet Take 1 tablet (50 mg total) by mouth daily. 90 tablet 0   No current facility-administered medications on file prior to visit.      BP 120/76   Temp 98.3 F (36.8 C)   Wt 124 lb (56.2 kg)   BMI 24.22 kg/m       Objective:   Physical Exam Vitals signs reviewed.  Constitutional:      Appearance: Normal appearance.  HENT:     Right Ear: Ear canal and external ear normal. A middle ear effusion is present. There is no impacted cerumen.     Left Ear: Ear canal and external ear normal. A middle ear effusion is present. There is no impacted cerumen.     Nose: Rhinorrhea present. No congestion. Rhinorrhea is clear.     Right Turbinates: Not enlarged or swollen.     Left Turbinates: Not enlarged or swollen.     Right Sinus: No maxillary sinus tenderness or frontal sinus tenderness.     Left Sinus: No maxillary sinus tenderness or frontal sinus tenderness.     Mouth/Throat:     Mouth: Mucous membranes are moist.     Pharynx: Oropharynx is clear. No pharyngeal swelling, oropharyngeal exudate or posterior oropharyngeal erythema.     Tonsils: Tonsillar exudate present. No tonsillar abscesses.  Eyes:     Extraocular Movements: Extraocular movements intact.     Pupils: Pupils are equal, round, and reactive to light.  Cardiovascular:     Rate and Rhythm: Normal rate and regular rhythm.     Pulses: Normal pulses.     Heart sounds: Normal heart sounds.  Pulmonary:     Effort: Pulmonary effort is normal.     Breath sounds: Normal breath sounds.  Musculoskeletal:        General: Tenderness (With palpation to the left MCP joint) present. No swelling or deformity.  Skin:    General: Skin is warm and dry.  Neurological:     General: No focal deficit present.     Mental Status: She is alert and oriented to person, place, and time.  Psychiatric:        Mood and Affect: Mood normal.        Behavior: Behavior normal.        Thought Content: Thought content normal.        Judgment: Judgment normal.        Assessment & Plan:  1. Seasonal allergies -No signs of bacterial infection.  Appears to be more seasonal allergies.   Will send in Flonase and Medrol Dosepak which will hopefully  cover for arthritis of the left hand. - fluticasone (FLONASE) 50 MCG/ACT nasal spray; Place 2 sprays into both nostrils daily.  Dispense: 16 g; Refill: 6 - methylPREDNISolone (MEDROL DOSEPAK) 4 MG TBPK tablet; Take as directed  Dispense: 21 tablet; Refill: 0  2. Primary osteoarthritis of left hand -Is okay with trialing Medrol Dosepak prior to being referred to hand surgery. -Follow-up if no improvement - methylPREDNISolone (MEDROL DOSEPAK) 4 MG TBPK tablet; Take as directed  Dispense: 21 tablet; Refill: 0  Dorothyann Peng, NP

## 2018-11-19 NOTE — Telephone Encounter (Signed)
Author phoned pt. to traige symptoms prior to arrival. Pt. Stated she has had ST, low grade fever X 5 days, and it is traveling to chest now, with productive yellow cough. No more fevers at this time. Pt. Stated she was working in the yard on Friday, and symptoms began Saturday. Pt. States she thinks it is a sinus infection, as she gets them around this time every year, and it often presents with ST. Pt. denies recent travel outside the county or country, or known exposure to anyone exposed or infected with covid-19 recently. Author instructed pt. to proceed with check-in at front of office. PCP made aware.

## 2018-11-19 NOTE — Telephone Encounter (Signed)
LM for CV prescreen

## 2018-12-12 ENCOUNTER — Other Ambulatory Visit: Payer: Self-pay | Admitting: Adult Health

## 2018-12-12 DIAGNOSIS — F329 Major depressive disorder, single episode, unspecified: Secondary | ICD-10-CM

## 2018-12-15 NOTE — Telephone Encounter (Signed)
Sent to the pharmacy by e-scribe. 

## 2019-03-25 ENCOUNTER — Encounter: Payer: Self-pay | Admitting: Adult Health

## 2019-03-25 ENCOUNTER — Other Ambulatory Visit: Payer: Self-pay

## 2019-03-25 ENCOUNTER — Ambulatory Visit (INDEPENDENT_AMBULATORY_CARE_PROVIDER_SITE_OTHER): Payer: Medicare Other | Admitting: Adult Health

## 2019-03-25 VITALS — BP 116/78 | Temp 98.0°F | Wt 122.0 lb

## 2019-03-25 DIAGNOSIS — M25511 Pain in right shoulder: Secondary | ICD-10-CM

## 2019-03-25 MED ORDER — METHYLPREDNISOLONE ACETATE 80 MG/ML IJ SUSP
80.0000 mg | Freq: Once | INTRAMUSCULAR | Status: AC
  Administered 2019-03-25: 80 mg via INTRA_ARTICULAR

## 2019-03-25 NOTE — Progress Notes (Signed)
Subjective:    Patient ID: Sally Shea, female    DOB: [birth year], 73 y.o.   MRN: [id number]  HPI 73 year old female who  has a past medical history of Allergy, Anxiety, Blood transfusion without reported diagnosis, Degenerative cervical disc, Depression, Headache(784.0), and Hypertension.  She presents to the office today for 2 weeks of right shoulder pain. She reports " I threw my shoulder out killing fruit flies". Pain is located throughout the right shoulder. She has been getting massages which help short term and feel as though message therapy is helping with ROM. Pain is described as an " aching and sharp" pain and feels as though it is coming from " inside the shoulder"  No numbness or tingling in her right arm or fingers. Has some decreased grip strength    Review of Systems See HPI   Past Medical History:  Diagnosis Date  . Allergy    seasonal  . Anxiety   . Blood transfusion without reported diagnosis    years ago  . Degenerative cervical disc   . Depression   . Headache(784.0)   . Hypertension     Social History   Socioeconomic History  . Marital status: Married    Spouse name: Not on file  . Number of children: Not on file  . Years of education: Not on file  . Highest education level: Not on file  Occupational History  . Not on file  Social Needs  . Financial resource strain: Not on file  . Food insecurity    Worry: Not on file    Inability: Not on file  . Transportation needs    Medical: Not on file    Non-medical: Not on file  Tobacco Use  . Smoking status: Never Smoker  . Smokeless tobacco: Never Used  Substance and Sexual Activity  . Alcohol use: No  . Drug use: No  . Sexual activity: Not on file  Lifestyle  . Physical activity    Days per week: Not on file    Minutes per session: Not on file  . Stress: Not on file  Relationships  . Social Herbalist on phone: Not on file    Gets together: Not on file    Attends religious  service: Not on file    Active member of club or organization: Not on file    Attends meetings of clubs or organizations: Not on file    Relationship status: Not on file  . Intimate partner violence    Fear of current or ex partner: Not on file    Emotionally abused: Not on file    Physically abused: Not on file    Forced sexual activity: Not on file  Other Topics Concern  . Not on file  Social History Narrative  . Not on file    Past Surgical History:  Procedure Laterality Date  . COLONOSCOPY    . EYE SURGERY    . TUBAL LIGATION      Family History  Problem Relation Age of Onset  . Heart disease Mother   . Alcohol abuse Other   . Anxiety disorder Other   . Depression Other   . Hyperlipidemia Other   . Hypertension Other   . Colon cancer Neg Hx     Allergies  Allergen Reactions  . Sulfamethoxazole     REACTION: thrush    Current Outpatient Medications on File Prior to Visit  Medication Sig Dispense Refill  .  aspirin 81 MG tablet Take 81 mg by mouth daily.      . calcium citrate-vitamin D (CITRACAL+D) 315-200 MG-UNIT per tablet Take 1 tablet by mouth daily.      Marland Kitchen conjugated estrogens (PREMARIN) vaginal cream Place 0.5 Applicatorfuls vaginally daily. 42.5 g 11  . fish oil-omega-3 fatty acids 1000 MG capsule Take 1 g by mouth daily.      . sertraline (ZOLOFT) 50 MG tablet TAKE 1 TABLET BY MOUTH EVERY DAY 90 tablet 1  . triamterene-hydrochlorothiazide (DYAZIDE) 37.5-25 MG capsule Take 1 each (1 capsule total) by mouth daily. 100 capsule 4   No current facility-administered medications on file prior to visit.     BP 116/78   Temp 98 F (36.7 C)   Wt 122 lb (55.3 kg)   BMI 23.83 kg/m       Objective:   Physical Exam Vitals signs and nursing note reviewed.  Constitutional:      Appearance: Normal appearance.  Musculoskeletal:        General: Tenderness present.     Right shoulder: She exhibits decreased range of motion, tenderness, bony tenderness, pain  and decreased strength. She exhibits no swelling, no crepitus and normal pulse.     Comments: Positive back scratch test - Pain in right shoulder against resistance   Skin:    General: Skin is warm and dry.  Neurological:     Mental Status: She is alert.       Assessment & Plan:  1. Acute pain of right shoulder - some concern for possible rotator cuff injury. Discussed treatment options. She opted for steroid injection.  - methylPREDNISolone acetate (DEPO-MEDROL) injection 80 mg - Will consider referral to orthopedics or sports medicine   Discussed risks and benefits of corticosteroid injection and patient consented.  After prepping skin with betadine, injected 80 mg depomedrol and 2 cc of plain xylocaine with 22 gauge one and one half inch needle using posterior approach and pt tolerated well.  Dorothyann Peng, NP

## 2019-04-01 ENCOUNTER — Other Ambulatory Visit: Payer: Self-pay | Admitting: Adult Health

## 2019-04-01 DIAGNOSIS — F329 Major depressive disorder, single episode, unspecified: Secondary | ICD-10-CM

## 2019-04-02 NOTE — Telephone Encounter (Signed)
DENIED.  FILLED ON 12/15/2018 FOR 6 MONTHS.  REQUEST IS TOO EARLY.

## 2019-06-24 ENCOUNTER — Emergency Department: Payer: Medicare Other

## 2019-06-24 ENCOUNTER — Other Ambulatory Visit: Payer: Self-pay

## 2019-06-24 ENCOUNTER — Emergency Department
Admission: EM | Admit: 2019-06-24 | Discharge: 2019-06-24 | Disposition: A | Discharge status: Home / Self Care | Payer: Medicare Other | Attending: Emergency Medicine | Admitting: Emergency Medicine

## 2019-06-24 ENCOUNTER — Encounter: Payer: Self-pay | Admitting: Emergency Medicine

## 2019-06-24 DIAGNOSIS — Z7982 Long term (current) use of aspirin: Secondary | ICD-10-CM | POA: Insufficient documentation

## 2019-06-24 DIAGNOSIS — T148XXA Other injury of unspecified body region, initial encounter: Secondary | ICD-10-CM

## 2019-06-24 DIAGNOSIS — M549 Dorsalgia, unspecified: Secondary | ICD-10-CM | POA: Diagnosis present

## 2019-06-24 DIAGNOSIS — Z79899 Other long term (current) drug therapy: Secondary | ICD-10-CM | POA: Insufficient documentation

## 2019-06-24 DIAGNOSIS — M546 Pain in thoracic spine: Secondary | ICD-10-CM

## 2019-06-24 DIAGNOSIS — I10 Essential (primary) hypertension: Secondary | ICD-10-CM | POA: Insufficient documentation

## 2019-06-24 LAB — BASIC METABOLIC PANEL
Anion gap: 12 (ref 5–15)
BUN: 17 mg/dL (ref 8–23)
CO2: 26 mmol/L (ref 22–32)
Calcium: 9.5 mg/dL (ref 8.9–10.3)
Chloride: 100 mmol/L (ref 98–111)
Creatinine, Ser: 0.85 mg/dL (ref 0.44–1.00)
GFR calc Af Amer: >60 mL/min (ref >60)
GFR calc non Af Amer: >60 mL/min (ref >60)
Glucose, Bld: 101 mg/dL — ABNORMAL HIGH (ref 70–99)
Potassium: 4 mmol/L (ref 3.5–5.1)
Sodium: 138 mmol/L (ref 135–145)

## 2019-06-24 LAB — CBC
HCT: 38.5 % (ref 36.0–46.0)
Hemoglobin: 13 g/dL (ref 12.0–15.0)
MCH: 30.2 pg (ref 26.0–34.0)
MCHC: 33.8 g/dL (ref 30.0–36.0)
MCV: 89.5 fL (ref 80.0–100.0)
Platelets: 350 10*3/uL (ref 150–400)
RBC: 4.3 MIL/uL (ref 3.87–5.11)
RDW: 11.9 % (ref 11.5–15.5)
WBC: 7.8 10*3/uL (ref 4.0–10.5)
nRBC: 0 % (ref 0.0–0.2)

## 2019-06-24 LAB — TROPONIN I (HIGH SENSITIVITY)
Troponin I (High Sensitivity): 3 ng/L (ref <18)
Troponin I (High Sensitivity): 4 ng/L (ref <18)

## 2019-06-24 MED ORDER — ACETAMINOPHEN 500 MG PO TABS
1000.0000 mg | ORAL_TABLET | Freq: Once | ORAL | Status: AC
  Administered 2019-06-24: 1000 mg via ORAL
  Filled 2019-06-24: qty 2

## 2019-06-24 MED ORDER — LIDOCAINE 5 % EX PTCH
1.0000 | MEDICATED_PATCH | CUTANEOUS | Status: DC
  Administered 2019-06-24: 1 via TRANSDERMAL
  Filled 2019-06-24: qty 1

## 2019-06-24 NOTE — ED Notes (Signed)
Pt verbalized understanding of discharge instructions. NAD at this time. 

## 2019-06-24 NOTE — ED Triage Notes (Signed)
Patient reports sudden onset of sharp pain under left shoulder blade. Patient reports pain is affecting her breathing. Patient denies cough. Reports mild nausea.

## 2019-06-24 NOTE — ED Provider Notes (Signed)
[facility] Emergency Department Provider Note   ____________________________________________   First MD Initiated Contact with Patient 06/24/19 1647     (approximate)  I have reviewed the triage vital signs and the nursing notes.   HISTORY  Chief Complaint Back Pain    HPI Sally Shea is a 73 y.o. female with past medical history of hypertension who presents to the ED complaining of back pain.  Patient reports that around 1030 this morning she had acute onset of pain underneath her right shoulder blade.  She describes it as a dull ache that is not exacerbated or alleviated by anything.  She does state it started after she was moving some pictures and large frames.  It is not associated with any chest pain or abdominal pain.  She also denies any fevers, cough, or shortness of breath.  She did not take anything for her symptoms prior to arrival.  She has not had any lower extremity numbness or weakness, denies any saddle anesthesia or urinary retention/incontinence.        Past Medical History:  Diagnosis Date  . Allergy    seasonal  . Anxiety   . Blood transfusion without reported diagnosis    years ago  . Degenerative cervical disc   . Depression   . Headache(784.0)   . Hypertension     Patient Active Problem List   Diagnosis Date Noted  . Acute cystitis with hematuria 09/25/2017  . Breast pain, left 07/02/2017  . Allergic rhinitis due to pollen 05/22/2012  . Senile atrophic vaginitis 06/20/2011  . Depression 01/27/2008  . Essential hypertension 01/27/2008    Past Surgical History:  Procedure Laterality Date  . COLONOSCOPY    . EYE SURGERY    . TUBAL LIGATION      Prior to Admission medications   Medication Sig Start Date End Date Taking? Authorizing Provider  aspirin 81 MG tablet Take 81 mg by mouth daily.      [provider]  calcium citrate-vitamin D (CITRACAL+D) 315-200 MG-UNIT per tablet Take 1 tablet by mouth daily.       [provider]  conjugated estrogens (PREMARIN) vaginal cream Place 0.5 Applicatorfuls vaginally daily. 07/15/18   Dorena Cookey, MD  fish oil-omega-3 fatty acids 1000 MG capsule Take 1 g by mouth daily.      [provider]  sertraline (ZOLOFT) 50 MG tablet TAKE 1 TABLET BY MOUTH EVERY DAY 12/15/18   Nafziger, [name], NP  triamterene-hydrochlorothiazide (DYAZIDE) 37.5-25 MG capsule Take 1 each (1 capsule total) by mouth daily. 07/15/18   Dorena Cookey, MD    Allergies Sulfamethoxazole  Family History  Problem Relation Age of Onset  . Heart disease Mother   . Alcohol abuse Other   . Anxiety disorder Other   . Depression Other   . Hyperlipidemia Other   . Hypertension Other   . Colon cancer Neg Hx     Social History Social History   Tobacco Use  . Smoking status: Never Smoker  . Smokeless tobacco: Never Used  Substance Use Topics  . Alcohol use: No  . Drug use: No    Review of Systems  Constitutional: No fever/chills Eyes: No visual changes. ENT: No sore throat. Cardiovascular: Denies chest pain. Respiratory: Denies shortness of breath. Gastrointestinal: No abdominal pain.  No nausea, no vomiting.  No diarrhea.  No constipation. Genitourinary: Negative for dysuria. Musculoskeletal: Positive for back pain. Skin: Negative for rash. Neurological: Negative for headaches, focal weakness  or numbness.  ____________________________________________   PHYSICAL EXAM:  VITAL SIGNS: ED Triage Vitals  Enc Vitals Group     BP 06/24/19 1444 (!) 172/84     Pulse Rate 06/24/19 1444 70     Resp 06/24/19 1444 18     Temp 06/24/19 1444 98.5 F (36.9 C)     Temp Source 06/24/19 1444 Oral     SpO2 06/24/19 1444 100 %     Weight 06/24/19 1441 120 lb (54.4 kg)     Height 06/24/19 1441 5\' 1"  (1.549 m)     Head Circumference --      Peak Flow --      Pain Score 06/24/19 1441 8     Pain Loc --      Pain Edu? --      Excl. in [place]? --     Constitutional:  Alert and oriented. Eyes: Conjunctivae are normal. Head: Atraumatic. Nose: No congestion/rhinnorhea. Mouth/Throat: Mucous membranes are moist. Neck: Normal ROM Cardiovascular: Normal rate, regular rhythm. Grossly normal heart sounds.  2+ radial pulses bilaterally. Respiratory: Normal respiratory effort.  No retractions. Lungs CTAB. Gastrointestinal: Soft and nontender. No distention. Genitourinary: deferred Musculoskeletal: No lower extremity tenderness nor edema.  Tenderness to palpation just medial to right scapula which reproduces patient's pain. Neurologic:  Normal speech and language. No gross focal neurologic deficits are appreciated. Skin:  Skin is warm, dry and intact. No rash noted. Psychiatric: Mood and affect are normal. Speech and behavior are normal.  ____________________________________________   LABS (all labs ordered are listed, but only abnormal results are displayed)  Labs Reviewed  BASIC METABOLIC PANEL - Abnormal; Notable for the following components:      Result Value   Glucose, Bld 101 (*)    All other components within normal limits  CBC  TROPONIN I (HIGH SENSITIVITY)  TROPONIN I (HIGH SENSITIVITY)   ____________________________________________  EKG  ED ECG REPORT I, Blake Divine, the attending physician, personally viewed and interpreted this ECG.   Date: 06/24/2019  EKG Time: 14:42  Rate: 72  Rhythm: normal sinus rhythm  Axis: LAD  Intervals:none  ST&T Change: None   PROCEDURES  Procedure(s) performed (including Critical Care):  Procedures   ____________________________________________   INITIAL IMPRESSION / ASSESSMENT AND PLAN / ED COURSE       73 year old female with history of hypertension presents to the ED with acute onset of right mid back pain near her scapula after moving pictures and frames.  Low suspicion for cardiac etiology as EKG shows no acute ischemic changes and initial troponin within normal limits.  Pain is  reproducible with palpation just medial to her right scapula, suspect musculoskeletal etiology.  Doubt PE as there is no pleuritic component and vitals are within normal limits.  Chest x-ray shows questionable opacity along right heart border, doubt pneumonia as she has no fevers or cough, counseled to follow-up with PCP for repeat imaging.  Will check second set troponin and treat with Tylenol and Lidoderm patch.  Patient reports symptoms improved, repeat troponin within normal limits.  Suspect musculoskeletal etiology of her symptoms, counseled patient to continue Tylenol as needed as well as ice and rest.  Counseled patient to follow-up with her PCP and return to the ED for new or worsening symptoms, patient agrees with plan.        ____________________________________________   FINAL CLINICAL IMPRESSION(S) / ED DIAGNOSES  Final diagnoses:  Acute right-sided thoracic back pain  Muscle strain     ED Discharge Orders  None       Note:  This document was prepared using Dragon voice recognition software and may include unintentional dictation errors.   Blake Divine, MD 06/24/19 2025

## 2019-06-25 ENCOUNTER — Encounter: Payer: Self-pay | Admitting: Adult Health

## 2019-06-25 ENCOUNTER — Ambulatory Visit (INDEPENDENT_AMBULATORY_CARE_PROVIDER_SITE_OTHER): Payer: Medicare Other | Admitting: Adult Health

## 2019-06-25 ENCOUNTER — Telehealth: Payer: Medicare Other | Admitting: Adult Health

## 2019-06-25 ENCOUNTER — Ambulatory Visit
Admission: RE | Admit: 2019-06-25 | Discharge: 2019-06-25 | Disposition: A | Discharge status: Home / Self Care | Payer: Medicare Other | Source: Ambulatory Visit | Attending: Adult Health | Admitting: Adult Health

## 2019-06-25 DIAGNOSIS — R1011 Right upper quadrant pain: Secondary | ICD-10-CM

## 2019-06-25 LAB — COMPREHENSIVE METABOLIC PANEL
ALT: 18 U/L (ref 0–35)
AST: 20 U/L (ref 0–37)
Albumin: 4.4 g/dL (ref 3.5–5.2)
Alkaline Phosphatase: 57 U/L (ref 39–117)
BUN: 12 mg/dL (ref 6–23)
CO2: 31 mEq/L (ref 19–32)
Calcium: 9.6 mg/dL (ref 8.4–10.5)
Chloride: 96 mEq/L (ref 96–112)
Creatinine, Ser: 0.78 mg/dL (ref 0.40–1.20)
GFR: 72.49 mL/min (ref >60.00)
Glucose, Bld: 103 mg/dL — ABNORMAL HIGH (ref 70–99)
Potassium: 3.7 mEq/L (ref 3.5–5.1)
Sodium: 135 mEq/L (ref 135–145)
Total Bilirubin: 0.6 mg/dL (ref 0.2–1.2)
Total Protein: 6.7 g/dL (ref 6.0–8.3)

## 2019-06-25 LAB — AMYLASE: Amylase: 47 U/L (ref 27–131)

## 2019-06-25 LAB — LIPASE: Lipase: 17 U/L (ref 11.0–59.0)

## 2019-06-25 MED ORDER — ONDANSETRON HCL 4 MG PO TABS: 4.0000 mg | ORAL_TABLET | Freq: Three times a day (TID) | ORAL | 0 refills | Status: DC | PRN

## 2019-06-25 MED ORDER — IOHEXOL 300 MG/ML  SOLN
100.0000 mL | Freq: Once | INTRAMUSCULAR | Status: AC | PRN
  Administered 2019-06-25: 100 mL via INTRAVENOUS

## 2019-06-25 MED ORDER — ONDANSETRON HCL 4 MG/2ML IJ SOLN
4.0000 mg | Freq: Once | INTRAMUSCULAR | Status: AC
  Administered 2019-06-25: 4 mg via INTRAMUSCULAR

## 2019-06-25 MED ORDER — HYDROCODONE-ACETAMINOPHEN 5-325 MG PO TABS: 1.0000 | ORAL_TABLET | Freq: Four times a day (QID) | ORAL | 0 refills | Status: DC | PRN

## 2019-06-25 NOTE — Progress Notes (Signed)
Subjective:    Patient ID: Sally Shea, female    DOB: 1945/10/08, 73 y.o.   MRN: [id number]  HPI 73 year old female who  has a past medical history of Allergy, Anxiety, Blood transfusion without reported diagnosis, Degenerative cervical disc, Depression, Headache(784.0), and Hypertension.  She was seen in the emergency room yesterday for  the complaint of back pain.  She reported that around 1030 yesterday morning she had an acute onset of back pain underneath her right shoulder blade.  This was described as a dull ache is not exacerbated or alleviated by anything.  That time she was moving some pictures with large frames.  ER note reports that she had no associated chest pain or abdominal pain.  She denied fevers, cough, or shortness of breath.  CBC and BMP were unremarkable.  Her chest x-ray showed questionable opacity along the right heart border, was doubtful that this was pneumonia she had no fevers or cough.  He was diagnosed with a muscle strain and prescribed Tylenol and Lidoderm patch.  Today she reports that since being discharged from the emergency room she continues to have pain in her upper back between her shoulders, right-sided abdominal pain, nausea, vomiting, and diarrhea.  She has not experienced fevers or chills shortness of breath or chest pain.  She has been a unable to keep anything down p.o.  Her last meal was 7 AM yesterday    Review of Systems See HPI   Past Medical History:  Diagnosis Date  . Allergy    seasonal  . Anxiety   . Blood transfusion without reported diagnosis    years ago  . Degenerative cervical disc   . Depression   . Headache(784.0)   . Hypertension     Social History   Socioeconomic History  . Marital status: Married    Spouse name: Not on file  . Number of children: Not on file  . Years of education: Not on file  . Highest education level: Not on file  Occupational History  . Not on file  Social Needs  . Financial resource  strain: Not on file  . Food insecurity    Worry: Not on file    Inability: Not on file  . Transportation needs    Medical: Not on file    Non-medical: Not on file  Tobacco Use  . Smoking status: Never Smoker  . Smokeless tobacco: Never Used  Substance and Sexual Activity  . Alcohol use: No  . Drug use: No  . Sexual activity: Not on file  Lifestyle  . Physical activity    Days per week: Not on file    Minutes per session: Not on file  . Stress: Not on file  Relationships  . Social Herbalist on phone: Not on file    Gets together: Not on file    Attends religious service: Not on file    Active member of club or organization: Not on file    Attends meetings of clubs or organizations: Not on file    Relationship status: Not on file  . Intimate partner violence    Fear of current or ex partner: Not on file    Emotionally abused: Not on file    Physically abused: Not on file    Forced sexual activity: Not on file  Other Topics Concern  . Not on file  Social History Narrative  . Not on file    Past Surgical History:  Procedure Laterality Date  . COLONOSCOPY    . EYE SURGERY    . TUBAL LIGATION      Family History  Problem Relation Age of Onset  . Heart disease Mother   . Alcohol abuse Other   . Anxiety disorder Other   . Depression Other   . Hyperlipidemia Other   . Hypertension Other   . Colon cancer Neg Hx     Allergies  Allergen Reactions  . Sulfamethoxazole     REACTION: thrush    Current Outpatient Medications on File Prior to Visit  Medication Sig Dispense Refill  . aspirin 81 MG tablet Take 81 mg by mouth daily.      . calcium citrate-vitamin D (CITRACAL+D) 315-200 MG-UNIT per tablet Take 1 tablet by mouth daily.      Marland Kitchen conjugated estrogens (PREMARIN) vaginal cream Place 0.5 Applicatorfuls vaginally daily. 42.5 g 11  . fish oil-omega-3 fatty acids 1000 MG capsule Take 1 g by mouth daily.      . sertraline (ZOLOFT) 50 MG tablet TAKE 1  TABLET BY MOUTH EVERY DAY 90 tablet 1  . triamterene-hydrochlorothiazide (DYAZIDE) 37.5-25 MG capsule Take 1 each (1 capsule total) by mouth daily. 100 capsule 4   No current facility-administered medications on file prior to visit.     BP 132/82   Temp 98.3 F (36.8 C)   Wt 121 lb (54.9 kg)   BMI 22.86 kg/m       Objective:   Physical Exam Vitals signs and nursing note reviewed.  Constitutional:      Appearance: Normal appearance. She is normal weight. She is ill-appearing.  HENT:     Head: Normocephalic and atraumatic.  Cardiovascular:     Rate and Rhythm: Normal rate and regular rhythm.     Pulses: Normal pulses.     Heart sounds: Normal heart sounds.  Pulmonary:     Effort: Pulmonary effort is normal.     Breath sounds: Normal breath sounds.  Abdominal:     General: Abdomen is flat. Bowel sounds are normal.     Tenderness: There is abdominal tenderness in the right upper quadrant. There is guarding. There is no right CVA tenderness, left CVA tenderness or rebound. Positive signs include Murphy's sign. Negative signs include Rovsing's sign, McBurney's sign and psoas sign.     Hernia: No hernia is present.  Musculoskeletal: Normal range of motion.  Skin:    General: Skin is warm and dry.  Neurological:     General: No focal deficit present.     Mental Status: She is alert and oriented to person, place, and time.  Psychiatric:        Mood and Affect: Mood normal.        Thought Content: Thought content normal.        Judgment: Judgment normal.       Assessment & Plan:  1. Right upper quadrant pain - Concern for gallbladder disease.  - CT Abdomen Pelvis W Contrast; Future - ondansetron (ZOFRAN) injection 4 mg - POC Urinalysis Dipstick - CMP - Amylase - Lipase - HYDROcodone-acetaminophen (NORCO) 5-325 MG tablet; Take 1 tablet by mouth every 6 (six) hours as needed for moderate pain.  Dispense: 30 tablet; Refill: 0 - ondansetron (ZOFRAN) 4 MG tablet; Take 1  tablet (4 mg total) by mouth every 8 (eight) hours as needed for nausea or vomiting.  Dispense: 20 tablet; Refill: 0  Dorothyann Peng, NP

## 2019-06-26 ENCOUNTER — Ambulatory Visit: Payer: Medicare Other

## 2019-06-29 ENCOUNTER — Telehealth: Payer: Self-pay | Admitting: Adult Health

## 2019-06-29 NOTE — Telephone Encounter (Signed)
Copied from [place] [id number]. Topic: General - Other >> Jun 29, 2019  9:26 AM Keene Breath wrote: Reason for CRM: Patient called to ask that the doctor send in a referral to the Endo, Dr. Fuller Plan, she believes.  Please let her know when the referral has been approved.  CB# [phone]

## 2019-06-30 ENCOUNTER — Encounter: Payer: Self-pay | Admitting: Physician Assistant

## 2019-06-30 ENCOUNTER — Other Ambulatory Visit: Payer: Self-pay | Admitting: Adult Health

## 2019-06-30 ENCOUNTER — Telehealth: Payer: Self-pay | Admitting: Family Medicine

## 2019-06-30 DIAGNOSIS — R1013 Epigastric pain: Secondary | ICD-10-CM

## 2019-06-30 NOTE — Telephone Encounter (Signed)
Copied from [employer] [place] [id number]. Topic: General - Other >> Jun 26, 2019  4:02 PM [name] wrote: Patient requesting call back from [facility]. Patient would not disclose information other than she forgot to ask him a question at last visit.

## 2019-06-30 NOTE — Telephone Encounter (Signed)
Referral placed.

## 2019-06-30 NOTE — Telephone Encounter (Signed)
Spoke to the pt.  She would like a referral to endoscopy.  Feels like she may have an ulcer.  Has had them since she was 89 and really thinks that is the problem.

## 2019-06-30 NOTE — Telephone Encounter (Signed)
Pt notified that referral has been placed.  Nothing further needed.

## 2019-07-02 ENCOUNTER — Other Ambulatory Visit: Payer: Self-pay | Admitting: Adult Health

## 2019-07-02 DIAGNOSIS — F329 Major depressive disorder, single episode, unspecified: Secondary | ICD-10-CM

## 2019-07-03 NOTE — Telephone Encounter (Signed)
Sent to the pharmacy by e-scribe. 

## 2019-07-09 ENCOUNTER — Other Ambulatory Visit: Payer: Self-pay

## 2019-07-09 ENCOUNTER — Encounter: Payer: Self-pay | Admitting: Physician Assistant

## 2019-07-09 ENCOUNTER — Ambulatory Visit: Payer: Medicare Other | Admitting: Physician Assistant

## 2019-07-09 VITALS — BP 118/64 | HR 73 | Temp 97.0°F | Ht 61.0 in | Wt 123.0 lb

## 2019-07-09 DIAGNOSIS — R1310 Dysphagia, unspecified: Secondary | ICD-10-CM

## 2019-07-09 DIAGNOSIS — R1013 Epigastric pain: Secondary | ICD-10-CM

## 2019-07-09 DIAGNOSIS — R110 Nausea: Secondary | ICD-10-CM

## 2019-07-09 DIAGNOSIS — K219 Gastro-esophageal reflux disease without esophagitis: Secondary | ICD-10-CM

## 2019-07-09 DIAGNOSIS — Z1159 Encounter for screening for other viral diseases: Secondary | ICD-10-CM

## 2019-07-09 MED ORDER — PANTOPRAZOLE SODIUM 40 MG PO TBEC: 40.0000 mg | DELAYED_RELEASE_TABLET | Freq: Every day | ORAL | 3 refills | Status: DC

## 2019-07-09 NOTE — Progress Notes (Signed)
Agree with assessment and plan as outlined.  

## 2019-07-09 NOTE — Progress Notes (Signed)
Chief Complaint: Epigastric pain, nausea, GERD  HPI:    [name] is a 73 year old female with a past medical history as listed below, known to Dr. Havery Moros for a colonoscopy, who was referred to me by Sally Peng, NP for a complaint of epigastric pain, nausea and GERD.      05/24/2015 colonoscopy was normal.  Repeat recommended in 10 years.    06/25/2019 patient saw primary care provider after being seen in the ER the day before for back pain.  At that time described pain underneath her right shoulder blade.  That time CBC and BMP were unremarkable, chest x-ray showed questionable opacity along the right heart border but was doubtful this is pneumonia she had no fever or cough.  She was diagnosed with a muscle strain and prescribed Tylenol and Lidoderm patch.  At that time a CT the abdomen pelvis was ordered and patient given Zofran for complaint of nausea.    06/25/2019 CT abdomen pelvis with contrast showed chronic changes including diffuse fatty infiltration of the liver but no acute abnormality.    06/25/2019 CMP, amylase, lipase normal.    Today, the patient tells me that the age of 60 she was diagnosed with bleeding duodenal ulcers.  Tells me she has always had some reflux but over the past year she has had problems with this all the time.  Also tells me that she "holds stress in my throat and when I am really hungry or stressed it just closes down and nothing can go down my throat".  This has been happening occasionally over the past year.  Tells me she went to the ER after what felt like a trapped gas pain was stuck under her shoulder blade, she thought she may be having atypical heart attack symptoms.  Has continued with this pain which also radiates over into in her epigastrium included with a loss of appetite and nausea.  Has been taking her husband's Pantoprazole 40 mg daily over the past week which has been helping with the symptoms.  Has been using Zofran which does help with the  nausea.    Denies fever, chills, weight loss, vomiting or symptoms that awaken her from sleep.  Past Medical History:  Diagnosis Date   Allergy    seasonal   Anxiety    Blood transfusion without reported diagnosis    years ago   Degenerative cervical disc    Depression    Headache(784.0)    Hypertension     Past Surgical History:  Procedure Laterality Date   COLONOSCOPY     EYE SURGERY     TUBAL LIGATION      Current Outpatient Medications  Medication Sig Dispense Refill   aspirin 81 MG tablet Take 81 mg by mouth daily.       calcium citrate-vitamin D (CITRACAL+D) 315-200 MG-UNIT per tablet Take 1 tablet by mouth daily.       conjugated estrogens (PREMARIN) vaginal cream Place 0.5 Applicatorfuls vaginally daily. 42.5 g 11   fish oil-omega-3 fatty acids 1000 MG capsule Take 1 g by mouth daily.       HYDROcodone-acetaminophen (NORCO) 5-325 MG tablet Take 1 tablet by mouth every 6 (six) hours as needed for moderate pain. 30 tablet 0   ondansetron (ZOFRAN) 4 MG tablet Take 1 tablet (4 mg total) by mouth every 8 (eight) hours as needed for nausea or vomiting. 20 tablet 0   sertraline (ZOLOFT) 50 MG tablet TAKE 1 TABLET BY MOUTH EVERY DAY  90 tablet 0   triamterene-hydrochlorothiazide (DYAZIDE) 37.5-25 MG capsule Take 1 each (1 capsule total) by mouth daily. 100 capsule 4   No current facility-administered medications for this visit.     Allergies as of 07/09/2019 - Review Complete 06/25/2019  Allergen Reaction Noted   Sulfamethoxazole  02/13/2007    Family History  Problem Relation Age of Onset   Heart disease Mother    Alcohol abuse Other    Anxiety disorder Other    Depression Other    Hyperlipidemia Other    Hypertension Other    Colon cancer Neg Hx     Social History   Socioeconomic History   Marital status: Married    Spouse name: Not on file   Number of children: Not on file   Years of education: Not on file   Highest education  level: Not on file  Occupational History   Not on file  Social Needs   Financial resource strain: Not on file   Food insecurity    Worry: Not on file    Inability: Not on file   Transportation needs    Medical: Not on file    Non-medical: Not on file  Tobacco Use   Smoking status: Never Smoker   Smokeless tobacco: Never Used  Substance and Sexual Activity   Alcohol use: No   Drug use: No   Sexual activity: Not on file  Lifestyle   Physical activity    Days per week: Not on file    Minutes per session: Not on file   Stress: Not on file  Relationships   Social connections    Talks on phone: Not on file    Gets together: Not on file    Attends religious service: Not on file    Active member of club or organization: Not on file    Attends meetings of clubs or organizations: Not on file    Relationship status: Not on file   Intimate partner violence    Fear of current or ex partner: Not on file    Emotionally abused: Not on file    Physically abused: Not on file    Forced sexual activity: Not on file  Other Topics Concern   Not on file  Social History Narrative   Not on file    Review of Systems:    Constitutional: No weight loss, fever or chills Skin: No rash Cardiovascular: No chest pain Respiratory: No SOB  Gastrointestinal: See HPI and otherwise negative Genitourinary: No dysuria  Neurological: No headache Musculoskeletal: No new muscle or joint pain Hematologic: No bleeding  Psychiatric: No history of depression or anxiety   Physical Exam:  Vital signs: BP 118/64    Pulse 73    Temp (!) 97 F (36.1 C)    Ht 5\' 1"  (1.549 m)    Wt 123 lb (55.8 kg)    BMI 23.24 kg/m   Constitutional:   Pleasant Caucasian female appears to be in NAD, Well developed, Well nourished, alert and cooperative Head:  Normocephalic and atraumatic. Eyes:   PEERL, EOMI. No icterus. Conjunctiva pink. Ears:  Normal auditory acuity. Neck:  Supple Throat: Oral cavity and  pharynx without inflammation, swelling or lesion.  Respiratory: Respirations even and unlabored. Lungs clear to auscultation bilaterally.   No wheezes, crackles, or rhonchi.  Cardiovascular: Normal S1, S2. No MRG. Regular rate and rhythm. No peripheral edema, cyanosis or pallor.  Gastrointestinal:  Soft, nondistended, moderate epigastric ttp. No rebound or guarding. Normal  bowel sounds. No appreciable masses or hepatomegaly. Rectal:  Not performed.  Msk:  Symmetrical without gross deformities. Without edema, no deformity or joint abnormality.  Neurologic:  Alert and  oriented x4;  grossly normal neurologically.  Skin:   Dry and intact without significant lesions or rashes. Psychiatric: Demonstrates good judgement and reason without abnormal affect or behaviors.  RELEVANT LABS AND IMAGING: CBC    Component Value Date/Time   WBC 7.8 06/24/2019 1448   RBC 4.30 06/24/2019 1448   HGB 13.0 06/24/2019 1448   HCT 38.5 06/24/2019 1448   PLT 350 06/24/2019 1448   MCV 89.5 06/24/2019 1448   MCH 30.2 06/24/2019 1448   MCHC 33.8 06/24/2019 1448   RDW 11.9 06/24/2019 1448   LYMPHSABS 2.3 07/15/2018 1343   MONOABS 0.5 07/15/2018 1343   EOSABS 0.1 07/15/2018 1343   BASOSABS 0.1 07/15/2018 1343    CMP     Component Value Date/Time   NA 135 06/25/2019 1251   K 3.7 06/25/2019 1251   CL 96 06/25/2019 1251   CO2 31 06/25/2019 1251   GLUCOSE 103 (H) 06/25/2019 1251   BUN 12 06/25/2019 1251   CREATININE 0.78 06/25/2019 1251   CALCIUM 9.6 06/25/2019 1251   PROT 6.7 06/25/2019 1251   ALBUMIN 4.4 06/25/2019 1251   AST 20 06/25/2019 1251   ALT 18 06/25/2019 1251   ALKPHOS 57 06/25/2019 1251   BILITOT 0.6 06/25/2019 1251   GFRNONAA >60 06/24/2019 1448   GFRAA >60 06/24/2019 1448    Assessment: 1.  Epigastric pain: Over the past week, with increase in reflux symptoms and nausea, CT abdomen pelvis unrevealing, labs including lipase unrevealing; consider relation to PUD versus gastritis 2.   GERD: With above, better with Pantoprazole 3.  Dysphagia: Described as throat "closing", this happens when patient is stressed or really hungry; consider esophageal spasm versus stricture versus other 4.  Nausea  Plan: 1.  Recommend patient proceed with an EGD for further evaluation of symptoms.  Did discuss risks, benefits, limitations and alternatives and the patient agrees to proceed.  This was scheduled with Dr. Havery Moros in the [facility].  Covid testing will be performed at [facility] 2 days before. 2.  Prescribed Pantoprazole 40 mg daily, 30-60 minutes before breakfast #30 with 2 refills.  Patient tells me she would prefer not to be on this medication forever, told her she can discuss timing with Dr. Havery Moros after time of procedure. 3.  Reviewed antireflux diet and lifestyle modifications. 4.  Continue Zofran as needed for nausea. 5.  Patient to follow in clinic per recommendations from Dr. Havery Moros after time of procedure.  Ellouise Newer, PA-C [facility] 07/09/2019, 8:37 AM  Cc: Sally Peng, NP

## 2019-07-09 NOTE — Patient Instructions (Addendum)
You have been scheduled for an endoscopy. Please follow written instructions given to you at your visit today. If you use inhalers (even only as needed), please bring them with you on the day of your procedure.  We have sent the following medications to your pharmacy for you to pick up at your convenience:  Pantoprazole 40 mg daily

## 2019-07-10 ENCOUNTER — Encounter: Payer: Self-pay | Admitting: Gastroenterology

## 2019-07-13 LAB — SARS CORONAVIRUS 2 (TAT 6-24 HRS): SARS Coronavirus 2: NEGATIVE

## 2019-07-14 ENCOUNTER — Other Ambulatory Visit: Payer: Self-pay

## 2019-07-14 ENCOUNTER — Encounter: Payer: Self-pay | Admitting: Gastroenterology

## 2019-07-14 ENCOUNTER — Other Ambulatory Visit: Payer: Self-pay | Admitting: Gastroenterology

## 2019-07-14 ENCOUNTER — Ambulatory Visit (AMBULATORY_SURGERY_CENTER): Payer: Medicare Other | Admitting: Gastroenterology

## 2019-07-14 VITALS — BP 89/65 | HR 75 | Temp 98.5°F | Resp 13 | Ht 61.0 in | Wt 123.0 lb

## 2019-07-14 DIAGNOSIS — R1013 Epigastric pain: Secondary | ICD-10-CM

## 2019-07-14 DIAGNOSIS — K222 Esophageal obstruction: Secondary | ICD-10-CM

## 2019-07-14 DIAGNOSIS — R1310 Dysphagia, unspecified: Secondary | ICD-10-CM | POA: Diagnosis not present

## 2019-07-14 DIAGNOSIS — K2100 Gastro-esophageal reflux disease with esophagitis, without bleeding: Secondary | ICD-10-CM | POA: Diagnosis not present

## 2019-07-14 DIAGNOSIS — K449 Diaphragmatic hernia without obstruction or gangrene: Secondary | ICD-10-CM | POA: Diagnosis not present

## 2019-07-14 DIAGNOSIS — K219 Gastro-esophageal reflux disease without esophagitis: Secondary | ICD-10-CM

## 2019-07-14 MED ORDER — SUCRALFATE 1 GM/10ML PO SUSP: 1.0000 g | Freq: Four times a day (QID) | ORAL | 3 refills | Status: DC | PRN

## 2019-07-14 MED ORDER — SODIUM CHLORIDE 0.9 % IV SOLN: 500.0000 mL | Freq: Once | INTRAVENOUS | Status: DC

## 2019-07-14 NOTE — Progress Notes (Signed)
PT taken to PACU. Monitors in place. VSS. Report given to RN. 

## 2019-07-14 NOTE — Patient Instructions (Addendum)
Impression/Recommendations:  Hiatal hernia handout given to patient. Gastritis handout given to patient. Dilation diet handout given to patient.  Advance diet as tolerated.  Continue present medications.  Take Protonix 40 mg. Daily. Liquid carafate 10cc by mouth every 6 hours as needed.  Await pathology results.   YOU HAD AN ENDOSCOPIC PROCEDURE TODAY AT [facility]:   Refer to the procedure report that was given to you for any specific questions about what was found during the examination.  If the procedure report does not answer your questions, please call your gastroenterologist to clarify.  If you requested that your care partner not be given the details of your procedure findings, then the procedure report has been included in a sealed envelope for you to review at your convenience later.  YOU SHOULD EXPECT: Some feelings of bloating in the abdomen. Passage of more gas than usual.  Walking can help get rid of the air that was put into your GI tract during the procedure and reduce the bloating. If you had a lower endoscopy (such as a colonoscopy or flexible sigmoidoscopy) you may notice spotting of blood in your stool or on the toilet paper. If you underwent a bowel prep for your procedure, you may not have a normal bowel movement for a few days.  Please Note:  You might notice some irritation and congestion in your nose or some drainage.  This is from the oxygen used during your procedure.  There is no need for concern and it should clear up in a day or so.  SYMPTOMS TO REPORT IMMEDIATELY:  Following upper endoscopy (EGD)  Vomiting of blood or coffee ground material  New chest pain or pain under the shoulder blades  Painful or persistently difficult swallowing  New shortness of breath  Fever of 100F or higher  Black, tarry-looking stools  For urgent or emergent issues, a gastroenterologist can be reached at any hour by calling [phone].   DIET:  We do  recommend a small meal at first, but then you may proceed to your regular diet.  Drink plenty of fluids but you should avoid alcoholic beverages for 24 hours.  ACTIVITY:  You should plan to take it easy for the rest of today and you should NOT DRIVE or use heavy machinery until tomorrow (because of the sedation medicines used during the test).    FOLLOW UP: Our staff will call the number listed on your records 48-72 hours following your procedure to check on you and address any questions or concerns that you may have regarding the information given to you following your procedure. If we do not reach you, we will leave a message.  We will attempt to reach you two times.  During this call, we will ask if you have developed any symptoms of COVID 19. If you develop any symptoms (ie: fever, flu-like symptoms, shortness of breath, cough etc.) before then, please call [phone].  If you test positive for Covid 19 in the 2 weeks post procedure, please call and report this information to [place].    If any biopsies were taken you will be contacted by phone or by letter within the next 1-3 weeks.  Please call us at [phone] if you have not heard about the biopsies in 3 weeks.    SIGNATURES/CONFIDENTIALITY: You and/or your care partner have signed paperwork which will be entered into your electronic medical record.  These signatures attest to the fact that that the information above on  your After Visit Summary has been reviewed and is understood.  Full responsibility of the confidentiality of this discharge information lies with you and/or your care-partner. 

## 2019-07-14 NOTE — Progress Notes (Signed)
Called to room to assist during endoscopic procedure.  Patient ID and intended procedure confirmed with present staff. Received instructions for my participation in the procedure from the performing physician.  

## 2019-07-14 NOTE — Progress Notes (Signed)
VS- Randall Hiss RN Temperature- Lattie Haw Clapps

## 2019-07-14 NOTE — Op Note (Signed)
[facility] Patient Name: Sally Shea Procedure Date: 07/14/2019 4:21 PM MRN: [id number] Endoscopist: Remo Lipps P. Havery Moros , MD Age: 73 Referring MD:  Date of Birth: [birth year] Gender: Female Account #: [id number] Procedure:                Upper GI endoscopy Indications:              Epigastric abdominal pain, Dysphagia, Follow-up of                            gastro-esophageal reflux disease - trial of                            protonix 40mg  has helped significantly, although                            patient states she takes it every other day Medicines:                Monitored Anesthesia Care Procedure:                Pre-Anesthesia Assessment:                           - Prior to the procedure, a History and Physical                            was performed, and patient medications and                            allergies were reviewed. The patient's tolerance of                            previous anesthesia was also reviewed. The risks                            and benefits of the procedure and the sedation                            options and risks were discussed with the patient.                            All questions were answered, and informed consent                            was obtained. Prior Anticoagulants: The patient has                            taken no previous anticoagulant or antiplatelet                            agents. ASA Grade Assessment: II - A patient with                            mild systemic disease. After reviewing the risks  and benefits, the patient was deemed in                            satisfactory condition to undergo the procedure.                           After obtaining informed consent, the endoscope was                            passed under direct vision. Throughout the                            procedure, the patient's blood pressure, pulse, and                            oxygen  saturations were monitored continuously. The                            Endoscope was introduced through the mouth, and                            advanced to the second part of duodenum. The upper                            GI endoscopy was accomplished without difficulty.                            The patient tolerated the procedure well. Scope In: Scope Out: Findings:                 Esophagogastric landmarks were identified: the                            Z-line was found at 30 cm, the gastroesophageal                            junction was found at 30 cm and the upper extent of                            the gastric folds was found at 35 cm from the                            incisors.                           A 5 cm hiatal hernia was present.                           One benign-appearing, intrinsic moderate stenosis                            was found 30 cm from the incisors. This stenosis  measured less than one cm (in length). The stenosis                            was traversed. A TTS dilator was passed through the                            scope. Dilation with a 13.5-14.5-15.5 mm balloon                            dilator was performed to 13.5 mm, 14.5 mm and 15.5                            mm after which multiple appropriate mucosal wrents                            were noted. There was slight nodularity noted at                            the 6 o clock position at the GEJ which appeared                            benign / inflammatory. Biopsies were taken with a                            cold forceps for histology.                           Esophagitis was found at the GEJ with ulceration.                           The exam of the esophagus was otherwise normal.                           The entire examined stomach was normal.                           The duodenal bulb and second portion of the                            duodenum were  normal. Complications:            No immediate complications. Estimated blood loss:                            Minimal. Estimated Blood Loss:     Estimated blood loss was minimal. Impression:               - Esophagogastric landmarks identified.                           - 5 cm hiatal hernia.                           - Benign-appearing esophageal stenosis. Dilated to  15.45mm with good result. Biopsied.                           - Reflux esophagitis.                           - Normal stomach.                           - Normal duodenal bulb and second portion of the                            duodenum. Recommendation:           - Patient has a contact number available for                            emergencies. The signs and symptoms of potential                            delayed complications were discussed with the                            patient. Return to normal activities tomorrow.                            Written discharge instructions were provided to the                            patient.                           - Advance diet as tolerated.                           - Continue present medications.                           - Please take protonix 40mg  every day                           - Liquid carafate 10cc po every 6 hours as needed                           - Await pathology results.                           - I suspect patient will continue to have symptoms                            from reflux / esophagitis if not on medication                            routinely. If she does not wish to take medications  for this could consider surgical repair of hiatal                            hernia. I will discuss options with the patient. Remo Lipps P. Jadin Creque, MD 07/14/2019 5:06:58 PM This report has been signed electronically.

## 2019-07-16 ENCOUNTER — Telehealth: Payer: Self-pay

## 2019-07-16 NOTE — Telephone Encounter (Signed)
  Follow up Call-  Call back number 07/14/2019  Post procedure Call Back phone  # [phone]  Permission to leave phone message Yes  Some recent data might be hidden     Patient questions:  Do you have a fever, pain , or abdominal swelling? No. Pain Score  0 *  Have you tolerated food without any problems? Yes.    Have you been able to return to your normal activities? Yes.    Do you have any questions about your discharge instructions: Diet   No. Medications  No. Follow up visit  No.  Do you have questions or concerns about your Care? Yes.    Actions: * If pain score is 4 or above: No action needed, pain <4.  1. Have you developed a fever since your procedure? no  2.   Have you had an respiratory symptoms (SOB or cough) since your procedure? no  3.   Have you tested positive for COVID 19 since your procedure no  4.   Have you had any family members/close contacts diagnosed with the COVID 19 since your procedure?  no   If yes to any of these questions please route to Joylene John, RN and Alphonsa Gin, Therapist, sports.

## 2019-07-20 ENCOUNTER — Encounter: Payer: Self-pay | Admitting: Gastroenterology

## 2019-07-22 ENCOUNTER — Ambulatory Visit (INDEPENDENT_AMBULATORY_CARE_PROVIDER_SITE_OTHER): Payer: Medicare Other | Admitting: Adult Health

## 2019-07-22 ENCOUNTER — Other Ambulatory Visit: Payer: Self-pay

## 2019-07-22 ENCOUNTER — Encounter: Payer: Self-pay | Admitting: Adult Health

## 2019-07-22 VITALS — BP 120/70 | Temp 98.6°F | Ht 60.5 in | Wt 122.0 lb

## 2019-07-22 DIAGNOSIS — Z0000 Encounter for general adult medical examination without abnormal findings: Secondary | ICD-10-CM

## 2019-07-22 DIAGNOSIS — K219 Gastro-esophageal reflux disease without esophagitis: Secondary | ICD-10-CM

## 2019-07-22 DIAGNOSIS — F32A Depression, unspecified: Secondary | ICD-10-CM

## 2019-07-22 DIAGNOSIS — I10 Essential (primary) hypertension: Secondary | ICD-10-CM

## 2019-07-22 DIAGNOSIS — F419 Anxiety disorder, unspecified: Secondary | ICD-10-CM

## 2019-07-22 DIAGNOSIS — F329 Major depressive disorder, single episode, unspecified: Secondary | ICD-10-CM

## 2019-07-22 LAB — LIPID PANEL
Cholesterol: 230 mg/dL — ABNORMAL HIGH (ref 0–200)
HDL: 53.8 mg/dL (ref >39.00)
LDL Cholesterol: 153 mg/dL — ABNORMAL HIGH (ref 0–99)
NonHDL: 176.68
Total CHOL/HDL Ratio: 4
Triglycerides: 117 mg/dL (ref 0.0–149.0)
VLDL: 23.4 mg/dL (ref 0.0–40.0)

## 2019-07-22 LAB — TSH: TSH: 1.81 u[IU]/mL (ref 0.35–4.50)

## 2019-07-22 NOTE — Progress Notes (Signed)
Subjective:    Patient ID: Sally Shea, female    DOB: [birth year], 73 y.o.   MRN: [id number]  HPI Patient presents for yearly preventative medicine examination. She is a pleasant 73 year old female who  has a past medical history of Allergy, Anemia, Anxiety, Blood transfusion without reported diagnosis, Degenerative cervical disc, Depression, GERD (gastroesophageal reflux disease), Headache(784.0), and Hypertension.  Hypertension-takes Dyazide 37.5/25 mg.  She denies dizziness, lightheadedness, chest pain, shortness of breath, or syncopal episodes  BP Readings from Last 3 Encounters:  07/22/19 120/70  07/14/19 (!) 89/65  07/09/19 118/64    Anxiety/depression-well-controlled with Zoloft 50 mg  GERD-was seen in the emergency room on 06/25/2019 for back pain.  At that time was described as pain underneath the right shoulder blade.  CBC and BMP were unremarkable, chest x-ray showed questionable opacity along the right heart border but was doubtful this was pneumonia she had no fever cough.  She then was seen by me the next day and her work-up including CT abdomen pelvis as well as CMP, amylase, lipase were normal, except for chronic changes including diffuse fatty infiltration of the liver.  She was referred to GI.  Had started taking her husband's pantoprazole by the time she saw gastroenterology her symptoms were improving.  She had a upper endoscopy completed on 07/14/2019 which showed  - 5 cm hiatal hernia. - Benign-appearing esophageal stenosis. Dilated to 15.48mm with good result. Biopsied. - Reflux esophagitis. - Normal stomach. - Normal duodenal bulb and second portion of the duodenum.  She reports that she is feeling much improved since starting Protonix.   Hyperlipidemia - has refused statin in the past.  Lab Results  Component Value Date   CHOL 235 (H) 07/15/2018   HDL 56.20 07/15/2018   LDLCALC 150 (H) 07/15/2018   LDLDIRECT 176.0 06/13/2011   TRIG 141.0 07/15/2018   CHOLHDL 4 07/15/2018     All immunizations and health maintenance protocols were reviewed with the patient and needed orders were placed. She is up to date on vaccinations   Appropriate screening laboratory values were ordered for the patient including screening of hyperlipidemia, renal function and hepatic function.  Medication reconciliation,  past medical history, social history, problem list and allergies were reviewed in detail with the patient  Goals were established with regard to weight loss, exercise, and  diet in compliance with medications  End of life planning was discussed.  She is up to date on routine screening   She is up to date on routine screening colonoscopy, mammogram, dental and vision screens.   She has no acute complaints.    Review of Systems  Constitutional: Negative.   HENT: Negative.   Eyes: Negative.   Respiratory: Negative.   Cardiovascular: Negative.   Gastrointestinal: Negative.   Endocrine: Negative.   Genitourinary: Negative.   Musculoskeletal: Positive for arthralgias.  Skin: Negative.   Allergic/Immunologic: Negative.   Neurological: Negative.   Hematological: Negative.   Psychiatric/Behavioral: Negative.    Past Medical History:  Diagnosis Date  . Allergy    seasonal  . Anemia   . Anxiety   . Blood transfusion without reported diagnosis    years ago  . Degenerative cervical disc   . Depression   . GERD (gastroesophageal reflux disease)   . Headache(784.0)   . Hypertension     Social History   Socioeconomic History  . Marital status: Married    Spouse name: Not on file  . Number of children: Not on  file  . Years of education: Not on file  . Highest education level: Not on file  Occupational History  . Not on file  Social Needs  . Financial resource strain: Not on file  . Food insecurity    Worry: Not on file    Inability: Not on file  . Transportation needs    Medical: Not on file    Non-medical: Not on file   Tobacco Use  . Smoking status: Never Smoker  . Smokeless tobacco: Never Used  Substance and Sexual Activity  . Alcohol use: No  . Drug use: No  . Sexual activity: Not on file  Lifestyle  . Physical activity    Days per week: Not on file    Minutes per session: Not on file  . Stress: Not on file  Relationships  . Social Herbalist on phone: Not on file    Gets together: Not on file    Attends religious service: Not on file    Active member of club or organization: Not on file    Attends meetings of clubs or organizations: Not on file    Relationship status: Not on file  . Intimate partner violence    Fear of current or ex partner: Not on file    Emotionally abused: Not on file    Physically abused: Not on file    Forced sexual activity: Not on file  Other Topics Concern  . Not on file  Social History Narrative  . Not on file    Past Surgical History:  Procedure Laterality Date  . COLONOSCOPY    . EYE SURGERY    . TUBAL LIGATION      Family History  Problem Relation Age of Onset  . Heart disease Mother   . Alcohol abuse Other   . Anxiety disorder Other   . Depression Other   . Hyperlipidemia Other   . Hypertension Other   . Colon cancer Neg Hx   . Esophageal cancer Neg Hx   . Stomach cancer Neg Hx   . Rectal cancer Neg Hx     Allergies  Allergen Reactions  . Sulfamethoxazole     REACTION: thrush    Current Outpatient Medications on File Prior to Visit  Medication Sig Dispense Refill  . aspirin 81 MG tablet Take 81 mg by mouth daily.      . calcium citrate-vitamin D (CITRACAL+D) 315-200 MG-UNIT per tablet Take 1 tablet by mouth daily.      Marland Kitchen conjugated estrogens (PREMARIN) vaginal cream Place 0.5 Applicatorfuls vaginally daily. 42.5 g 11  . fish oil-omega-3 fatty acids 1000 MG capsule Take 1 g by mouth daily.      Marland Kitchen HYDROcodone-acetaminophen (NORCO) 5-325 MG tablet Take 1 tablet by mouth every 6 (six) hours as needed for moderate pain. 30 tablet  0  . ondansetron (ZOFRAN) 4 MG tablet Take 1 tablet (4 mg total) by mouth every 8 (eight) hours as needed for nausea or vomiting. 20 tablet 0  . pantoprazole (PROTONIX) 40 MG tablet Take 1 tablet (40 mg total) by mouth daily. 30 tablet 3  . sertraline (ZOLOFT) 50 MG tablet TAKE 1 TABLET BY MOUTH EVERY DAY 90 tablet 0  . sucralfate (CARAFATE) 1 GM/10ML suspension Take 10 mLs (1 g total) by mouth every 6 (six) hours as needed. 240 mL 3  . triamterene-hydrochlorothiazide (DYAZIDE) 37.5-25 MG capsule Take 1 each (1 capsule total) by mouth daily. 100 capsule 4   No  current facility-administered medications on file prior to visit.     BP 120/70   Temp 98.6 F (37 C) (Temporal)   Ht 5' 0.5" (1.537 m)   Wt 122 lb (55.3 kg)   BMI 23.43 kg/m       Objective:   Physical Exam Vitals signs and nursing note reviewed.  Constitutional:      General: She is not in acute distress.    Appearance: Normal appearance. She is well-developed.  HENT:     Head: Normocephalic and atraumatic.     Right Ear: Tympanic membrane, ear canal and external ear normal. There is no impacted cerumen.     Left Ear: Tympanic membrane, ear canal and external ear normal. There is no impacted cerumen.     Nose: Nose normal. No congestion or rhinorrhea.     Mouth/Throat:     Mouth: Mucous membranes are moist.     Pharynx: Oropharynx is clear. No oropharyngeal exudate.  Eyes:     General:        Right eye: No discharge.        Left eye: No discharge.     Extraocular Movements: Extraocular movements intact.     Conjunctiva/sclera: Conjunctivae normal.     Pupils: Pupils are equal, round, and reactive to light.  Neck:     Musculoskeletal: Normal range of motion and neck supple.     Thyroid: No thyromegaly.     Trachea: No tracheal deviation.  Cardiovascular:     Rate and Rhythm: Normal rate and regular rhythm.     Pulses: Normal pulses.     Heart sounds: Normal heart sounds. No murmur. No friction rub. No gallop.    Pulmonary:     Effort: Pulmonary effort is normal. No respiratory distress.     Breath sounds: Normal breath sounds. No stridor. No wheezing, rhonchi or rales.  Chest:     Chest wall: No tenderness.  Abdominal:     General: Bowel sounds are normal. There is no distension.     Palpations: Abdomen is soft. There is no mass.     Tenderness: There is no abdominal tenderness. There is no right CVA tenderness, left CVA tenderness, guarding or rebound.     Hernia: No hernia is present.  Musculoskeletal: Normal range of motion.        General: No swelling, tenderness, deformity or signs of injury.     Right lower leg: No edema.     Left lower leg: No edema.  Lymphadenopathy:     Cervical: No cervical adenopathy.  Skin:    General: Skin is warm and dry.     Capillary Refill: Capillary refill takes less than 2 seconds.     Coloration: Skin is not jaundiced or pale.     Findings: No bruising, erythema, lesion or rash.  Neurological:     General: No focal deficit present.     Mental Status: She is alert and oriented to person, place, and time.     Cranial Nerves: No cranial nerve deficit.     Sensory: No sensory deficit.     Motor: No weakness.     Coordination: Coordination normal.     Gait: Gait normal.     Deep Tendon Reflexes: Reflexes normal.  Psychiatric:        Mood and Affect: Mood normal.        Behavior: Behavior normal.        Thought Content: Thought content normal.  Judgment: Judgment normal.       Assessment & Plan:  1. Routine general medical examination at a health care facility - She has had a lot of labs done last month. I do not see a need to repeat those. Will check lipid and TSH - Continue to stay active and eat healthy  - Follow up in one one year or sooner if needed - Lipid panel - TSH  2. Essential hypertension - Well controlled. No change in medication  - Lipid panel - TSH  3. Anxiety and depression - Well controlled. No change in medications    4. Gastroesophageal reflux disease without esophagitis - Continue with Protonix   Dorothyann Peng, NP

## 2019-07-22 NOTE — Patient Instructions (Signed)
It was great seeing you today   We will follow up with you regarding your blood work   Please let me know if you need anything    

## 2019-09-23 ENCOUNTER — Telehealth: Payer: Self-pay | Admitting: *Deleted

## 2019-09-23 NOTE — Telephone Encounter (Signed)
Ok to write  Thanks

## 2019-09-23 NOTE — Telephone Encounter (Signed)
Patient is requesting a letter stating that she is cleared to go back to work.  Okay to write?  She is asymptomatic, quarantined for 10 days, and cleared by the [facility] Department.

## 2019-10-03 ENCOUNTER — Other Ambulatory Visit: Payer: Self-pay | Admitting: Adult Health

## 2019-10-03 DIAGNOSIS — F329 Major depressive disorder, single episode, unspecified: Secondary | ICD-10-CM

## 2019-10-09 ENCOUNTER — Other Ambulatory Visit: Payer: Self-pay | Admitting: Physician Assistant

## 2019-10-15 ENCOUNTER — Ambulatory Visit: Payer: Medicare PPO

## 2019-10-29 ENCOUNTER — Ambulatory Visit: Payer: Medicare PPO | Attending: Internal Medicine

## 2019-10-29 DIAGNOSIS — Z23 Encounter for immunization: Secondary | ICD-10-CM | POA: Insufficient documentation

## 2019-10-29 NOTE — Progress Notes (Signed)
   Covid-19 Vaccination Clinic  Name:  JNIYA KILGO    MRN: [id number] DOB: 10/15/1945  10/29/2019  Ms. Wessell was observed post Covid-19 immunization for 15 minutes without incidence. She was provided with Vaccine Information Sheet and instruction to access the V-Safe system.   Ms. Beckius was instructed to call 911 with any severe reactions post vaccine: Marland Kitchen Difficulty breathing  . Swelling of your face and throat  . A fast heartbeat  . A bad rash all over your body  . Dizziness and weakness    Immunizations Administered    Name Date Dose VIS Date Route   Pfizer COVID-19 Vaccine 10/29/2019 11:37 AM 0.3 mL 08/14/2019 Intramuscular   Manufacturer: [place]   Lot: [id number]   [place]: [id number]

## 2019-10-30 ENCOUNTER — Ambulatory Visit (INDEPENDENT_AMBULATORY_CARE_PROVIDER_SITE_OTHER): Payer: Medicare PPO | Admitting: Adult Health

## 2019-10-30 ENCOUNTER — Encounter: Payer: Self-pay | Admitting: Adult Health

## 2019-10-30 ENCOUNTER — Other Ambulatory Visit: Payer: Self-pay

## 2019-10-30 VITALS — BP 126/72 | Wt 122.0 lb

## 2019-10-30 DIAGNOSIS — R29898 Other symptoms and signs involving the musculoskeletal system: Secondary | ICD-10-CM

## 2019-10-30 DIAGNOSIS — R000 Tachycardia, unspecified: Secondary | ICD-10-CM | POA: Diagnosis not present

## 2019-10-30 DIAGNOSIS — M7121 Synovial cyst of popliteal space [Baker], right knee: Secondary | ICD-10-CM | POA: Diagnosis not present

## 2019-10-30 NOTE — Progress Notes (Addendum)
Subjective:    Patient ID: Sally Shea, female    DOB: [birth year], 74 y.o.   MRN: [id number]  HPI  74 year old female who  has a past medical history of Allergy, Anemia, Anxiety, Blood transfusion without reported diagnosis, Degenerative cervical disc, Depression, GERD (gastroesophageal reflux disease), Headache(784.0), and Hypertension.   She presents to the office today for an acute visit.  Her symptoms include tachycardia, feeling of heaviness in her bilateral lower extremities, and a "lump" in the back of her right knee.  This week she was in [place] visiting family members was doing a lot of walking and pushing a stroller, her diet was poor and she was not drinking enough fluids throughout the day.  4 to 5 days ago she started noticing that both of her legs felt "heavy" and had a mild aching sensation.  She has not noticed any redness, warmth, or swelling but a few days later she noticed a small lump behind her right knee that is not painful to touch.  This morning she reports "my arms became shaky" she checked her pulse and it was approximately 128 bpm.  She later checked her pulse and had come down to the mid 90s.  Usual pulse in the 70s.  He does report drinking a cup of coffee this morning, which she does every morning.  She has not been staying hydrated and feels "a little dry".  He denies chest pain, palpitations, shortness of breath, lower extremity edema, fevers, chills, or feeling acutely ill.  She did have her first Covid vaccination yesterday and has noticed some mild chills and headache as well as muscle aches.   Review of Systems See HPI   Past Medical History:  Diagnosis Date  . Allergy    seasonal  . Anemia   . Anxiety   . Blood transfusion without reported diagnosis    years ago  . Degenerative cervical disc   . Depression   . GERD (gastroesophageal reflux disease)   . Headache(784.0)   . Hypertension     Social History   Socioeconomic History  .  Marital status: Married    Spouse name: Not on file  . Number of children: Not on file  . Years of education: Not on file  . Highest education level: Not on file  Occupational History  . Not on file  Tobacco Use  . Smoking status: Never Smoker  . Smokeless tobacco: Never Used  Substance and Sexual Activity  . Alcohol use: No  . Drug use: No  . Sexual activity: Not on file  Other Topics Concern  . Not on file  Social History Narrative  . Not on file   Social Determinants of Health   Financial Resource Strain:   . Difficulty of Paying Living Expenses: Not on file  Food Insecurity:   . Worried About Charity fundraiser in the Last Year: Not on file  . Ran Out of Food in the Last Year: Not on file  Transportation Needs:   . Lack of Transportation (Medical): Not on file  . Lack of Transportation (Non-Medical): Not on file  Physical Activity:   . Days of Exercise per Week: Not on file  . Minutes of Exercise per Session: Not on file  Stress:   . Feeling of Stress : Not on file  Social Connections:   . Frequency of Communication with Friends and Family: Not on file  . Frequency of Social Gatherings with Friends and Family: Not  on file  . Attends Religious Services: Not on file  . Active Member of Clubs or Organizations: Not on file  . Attends Archivist Meetings: Not on file  . Marital Status: Not on file  Intimate Partner Violence:   . Fear of Current or Ex-Partner: Not on file  . Emotionally Abused: Not on file  . Physically Abused: Not on file  . Sexually Abused: Not on file    Past Surgical History:  Procedure Laterality Date  . COLONOSCOPY    . EYE SURGERY    . TUBAL LIGATION      Family History  Problem Relation Age of Onset  . Heart disease Mother   . Alcohol abuse Other   . Anxiety disorder Other   . Depression Other   . Hyperlipidemia Other   . Hypertension Other   . Colon cancer Neg Hx   . Esophageal cancer Neg Hx   . Stomach cancer Neg Hx    . Rectal cancer Neg Hx     Allergies  Allergen Reactions  . Sulfamethoxazole     REACTION: thrush    Current Outpatient Medications on File Prior to Visit  Medication Sig Dispense Refill  . aspirin 81 MG tablet Take 81 mg by mouth daily.      . calcium citrate-vitamin D (CITRACAL+D) 315-200 MG-UNIT per tablet Take 1 tablet by mouth daily.      Marland Kitchen conjugated estrogens (PREMARIN) vaginal cream Place 0.5 Applicatorfuls vaginally daily. 42.5 g 11  . fish oil-omega-3 fatty acids 1000 MG capsule Take 1 g by mouth daily.      . pantoprazole (PROTONIX) 40 MG tablet TAKE 1 TABLET BY MOUTH EVERY DAY 90 tablet 1  . sertraline (ZOLOFT) 50 MG tablet TAKE 1 TABLET BY MOUTH EVERY DAY 90 tablet 0  . sucralfate (CARAFATE) 1 GM/10ML suspension Take 10 mLs (1 g total) by mouth every 6 (six) hours as needed. 240 mL 3  . triamterene-hydrochlorothiazide (DYAZIDE) 37.5-25 MG capsule Take 1 each (1 capsule total) by mouth daily. 100 capsule 4   No current facility-administered medications on file prior to visit.    BP 126/72   Wt 122 lb (55.3 kg)   BMI 23.43 kg/m       Objective:   Physical Exam Vitals and nursing note reviewed.  Constitutional:      Appearance: Normal appearance. She is normal weight.  Cardiovascular:     Rate and Rhythm: Normal rate and regular rhythm.     Pulses: Normal pulses.     Heart sounds: Normal heart sounds.  Pulmonary:     Effort: Pulmonary effort is normal.     Breath sounds: Normal breath sounds.  Musculoskeletal:        General: No swelling, tenderness, deformity or signs of injury. Normal range of motion.     Right lower leg: No edema.     Left lower leg: No edema.     Comments: There is no noticeable abnormality to the anterior right knee.  She does have a Baker's cyst posterior right knee produces mild discomfort with deep palpation.  There is no redness, warmth, or swelling to the calf bilaterally.  Skin:    General: Skin is warm and dry.     Capillary  Refill: Capillary refill takes less than 2 seconds.  Neurological:     General: No focal deficit present.     Mental Status: She is alert and oriented to person, place, and time.  Psychiatric:  Mood and Affect: Mood normal.        Behavior: Behavior normal.        Thought Content: Thought content normal.        Judgment: Judgment normal.       Assessment & Plan:  1. Baker cyst, right -We discussed gust possible etiologies and her exam is consistent with a Baker's cyst.  Patient did not feel that having an x-ray was needed.  She will try conservative measures first.  There is no concern for DVT  2. Tachycardia - EKG 12-Lead- SR, rate 91 -Likely due to dehydration.  Patient did not want any further work-up - Encouraged to push plenty of fluid over the weekend.   3. Leg heaviness -Likelyfrom overwork syndrome.  Doubt PVD/PAD.  Encouraged adequate hydration and gentle stretching.  Follow-up if no improvement  Dorothyann Peng, NP  Time spent on chart review, time with patient; discussion including adequate hydration and symptoms of dehydration, treatment for bakers cysts,follow up plan, and documentation was 30 minutes

## 2019-11-07 ENCOUNTER — Ambulatory Visit: Payer: Medicare PPO

## 2019-11-07 IMAGING — CR DG CHEST 2V
1 series · 2 of 2 positions shown · non-contrast
Comparison: None.

CLINICAL DATA: Left inferior scapular pain and dyspnea

EXAM:
CHEST - 2 VIEW

[Series 1: dg chest 2 view · 0.14mm/px · 2 of 2 slices shown]
[im 1/2]
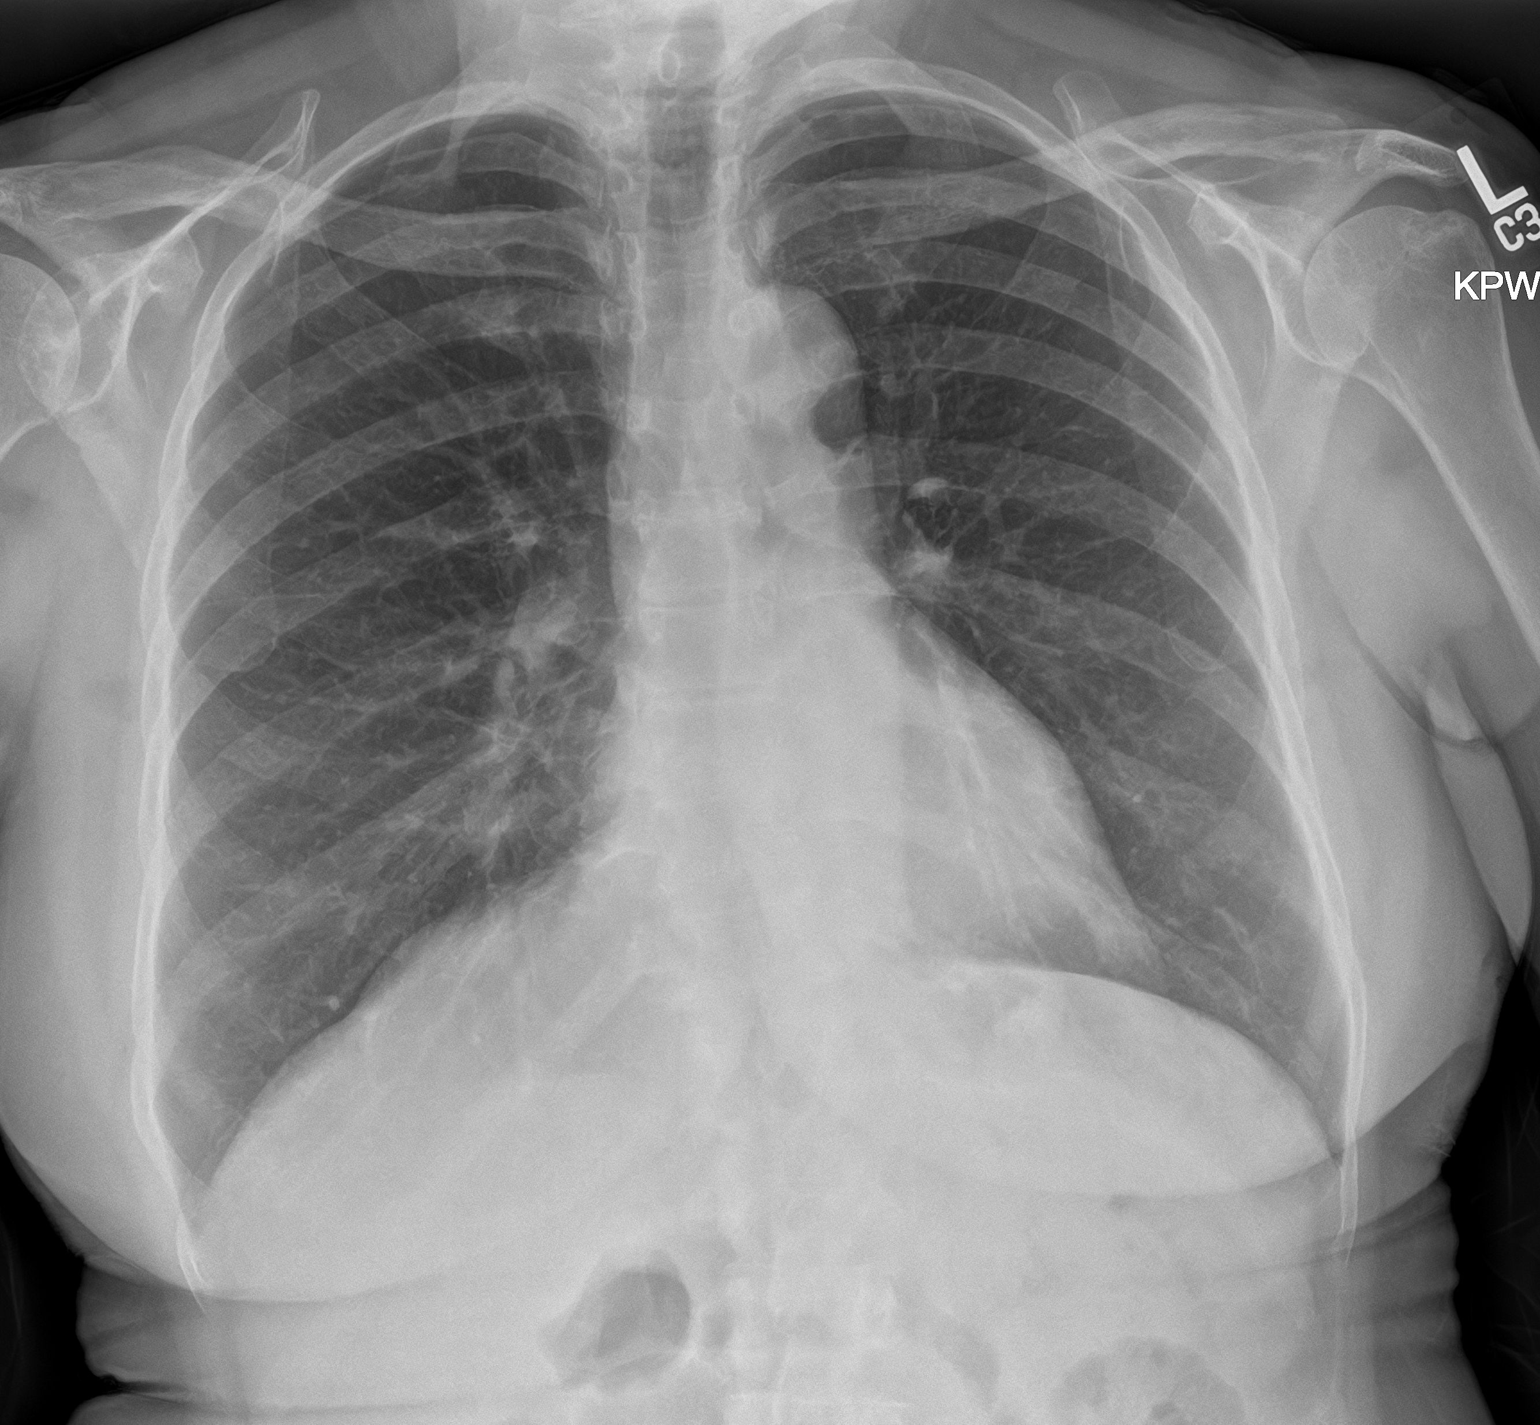
[im 2/2]
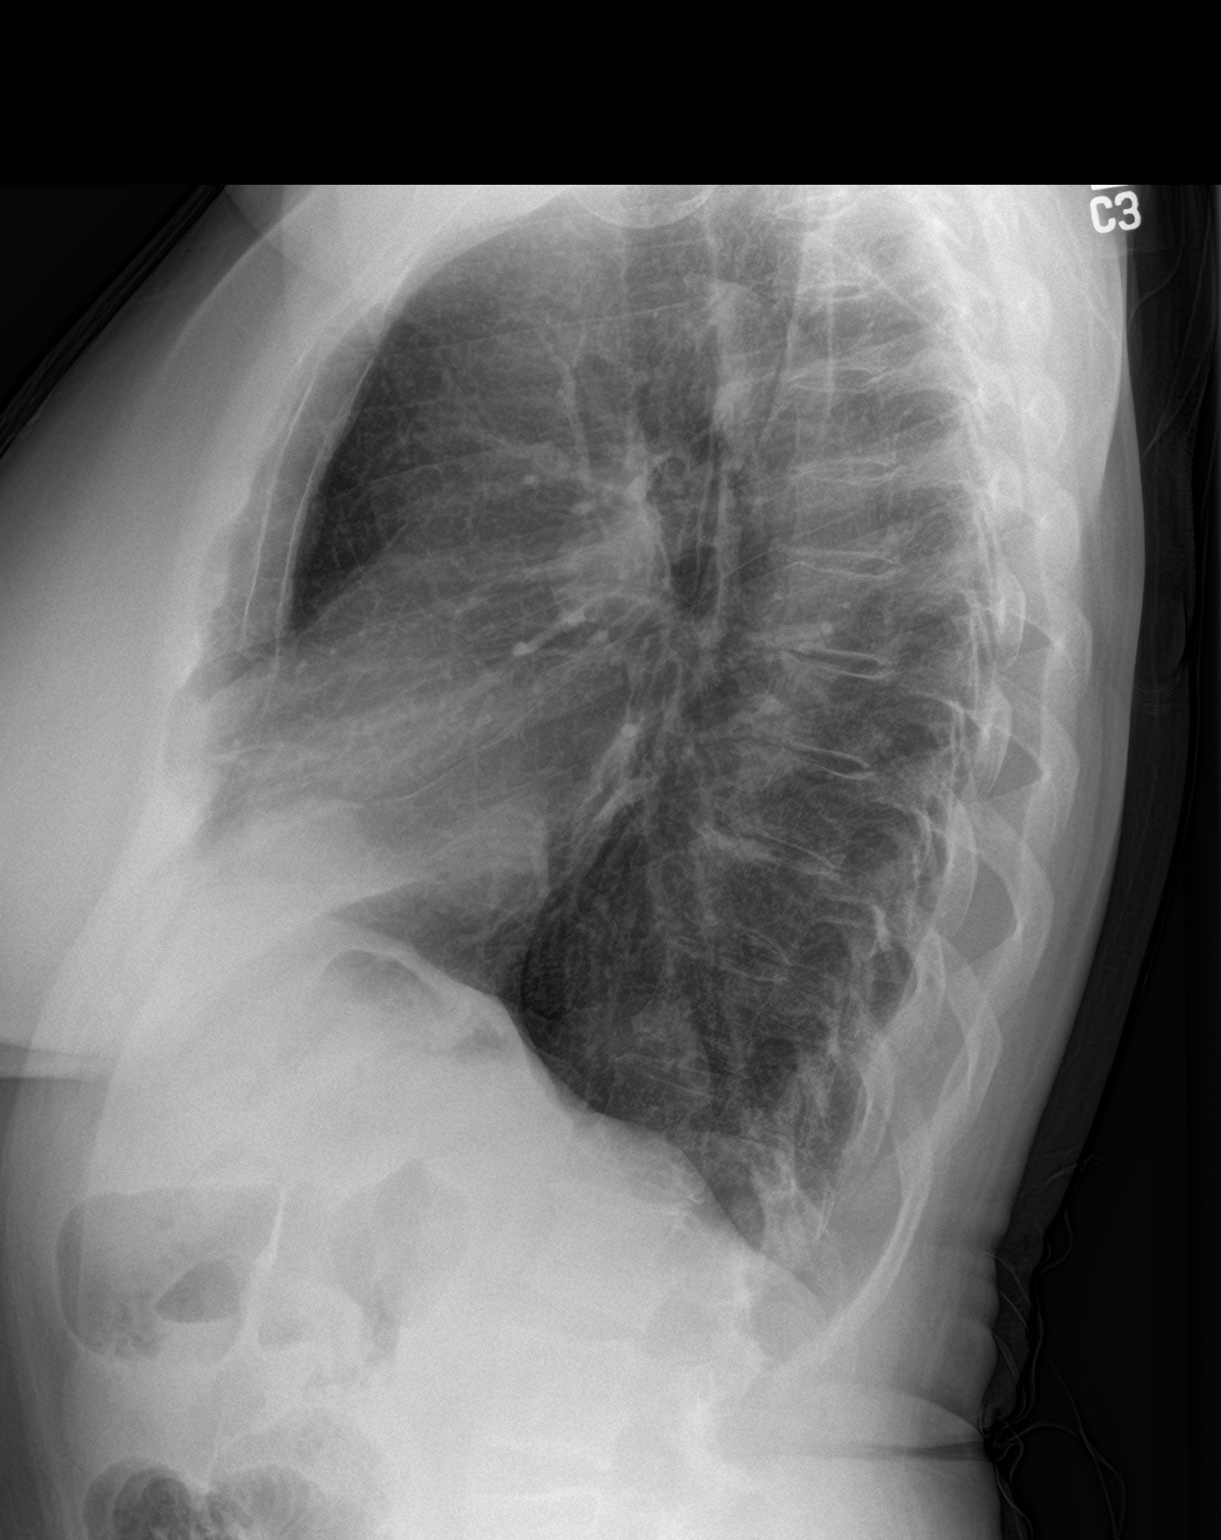

[2 of 2 positions shown; findings below may reference images not displayed]

FINDINGS: Normal heart size. Normal mediastinal contour. No pneumothorax. No
pleural effusion. Mild right middle lobe opacity obscuring the right
heart border. No pulmonary edema.
IMPRESSION: Mild right middle lobe opacity obscuring the right heart border,
nonspecific, potentially a scarring or atelectasis, with mild
pneumonia not excluded. Chest radiograph follow-up advised.

## 2019-11-16 ENCOUNTER — Other Ambulatory Visit: Payer: Self-pay | Admitting: Adult Health

## 2019-11-16 DIAGNOSIS — Z1231 Encounter for screening mammogram for malignant neoplasm of breast: Secondary | ICD-10-CM

## 2019-11-21 ENCOUNTER — Ambulatory Visit: Payer: Medicare PPO | Attending: Internal Medicine

## 2019-11-21 DIAGNOSIS — Z23 Encounter for immunization: Secondary | ICD-10-CM

## 2019-11-21 NOTE — Progress Notes (Signed)
   Covid-19 Vaccination Clinic  Name:  Sally Shea    MRN: [id number] DOB: [birth year]  11/21/2019  Sally Shea was observed post Covid-19 immunization for 15 minutes without incident. She was provided with Vaccine Information Sheet and instruction to access the V-Safe system.   Sally Shea was instructed to call 911 with any severe reactions post vaccine: Marland Kitchen Difficulty breathing  . Swelling of face and throat  . A fast heartbeat  . A bad rash all over body  . Dizziness and weakness   Immunizations Administered    Name Date Dose VIS Date Route   Pfizer COVID-19 Vaccine 11/21/2019 11:10 AM 0.3 mL 08/14/2019 Intramuscular   Manufacturer: [place]   Lot: [id number]   [place]: [id number]

## 2019-12-09 ENCOUNTER — Ambulatory Visit
Admission: RE | Admit: 2019-12-09 | Discharge: 2019-12-09 | Disposition: A | Discharge status: Home / Self Care | Payer: Medicare PPO | Source: Ambulatory Visit | Attending: Adult Health | Admitting: Adult Health

## 2019-12-09 ENCOUNTER — Other Ambulatory Visit: Payer: Self-pay

## 2019-12-09 DIAGNOSIS — Z1231 Encounter for screening mammogram for malignant neoplasm of breast: Secondary | ICD-10-CM

## 2019-12-15 ENCOUNTER — Other Ambulatory Visit: Payer: Self-pay | Admitting: Family Medicine

## 2019-12-15 DIAGNOSIS — I10 Essential (primary) hypertension: Secondary | ICD-10-CM

## 2019-12-15 MED ORDER — TRIAMTERENE-HCTZ 37.5-25 MG PO CAPS: 1.0000 | ORAL_CAPSULE | Freq: Every day | ORAL | 1 refills | Status: DC

## 2019-12-15 NOTE — Telephone Encounter (Signed)
Sent to the pharmacy by e-scribe. 

## 2020-01-11 ENCOUNTER — Other Ambulatory Visit: Payer: Self-pay | Admitting: Adult Health

## 2020-01-11 DIAGNOSIS — F329 Major depressive disorder, single episode, unspecified: Secondary | ICD-10-CM

## 2020-01-12 NOTE — Telephone Encounter (Signed)
Sent to the pharmacy by e-scribe. 

## 2020-03-16 ENCOUNTER — Ambulatory Visit: Payer: Medicare PPO | Admitting: Gastroenterology

## 2020-03-30 ENCOUNTER — Other Ambulatory Visit: Payer: Self-pay

## 2020-03-30 ENCOUNTER — Ambulatory Visit: Payer: Medicare PPO | Admitting: Adult Health

## 2020-03-30 ENCOUNTER — Encounter: Payer: Self-pay | Admitting: Adult Health

## 2020-03-30 VITALS — BP 110/74 | Temp 98.3°F

## 2020-03-30 DIAGNOSIS — G8929 Other chronic pain: Secondary | ICD-10-CM

## 2020-03-30 DIAGNOSIS — M25511 Pain in right shoulder: Secondary | ICD-10-CM | POA: Diagnosis not present

## 2020-03-30 MED ORDER — METHYLPREDNISOLONE ACETATE 80 MG/ML IJ SUSP
80.0000 mg | Freq: Once | INTRAMUSCULAR | Status: AC
  Administered 2020-03-30: 80 mg via INTRA_ARTICULAR

## 2020-03-30 MED ORDER — METHYLPREDNISOLONE ACETATE 80 MG/ML IJ SUSP: 80.0000 mg | Freq: Once | INTRAMUSCULAR | Status: DC

## 2020-03-30 NOTE — Progress Notes (Signed)
Subjective:    Patient ID: Sally Shea, female    DOB: 29-Apr-1946, 74 y.o.   MRN: [id number]  HPI  74 year old female who  has a past medical history of Allergy, Anemia, Anxiety, Blood transfusion without reported diagnosis, Degenerative cervical disc, Depression, GERD (gastroesophageal reflux disease), Headache(784.0), and Hypertension.  She presents to the office today for chronic right shoulder pain with loss of ROM. She had a shoulder injection about a year ago and had significant improvement until about 2 -3 weeks ago when she started to notice worsening pain in the right shoulder with loss of ROM. She feels as though pain is coming from inside her shoulder. Denies numbness or tingling in right arm or fingers.    Review of Systems See HPI   Past Medical History:  Diagnosis Date   Allergy    seasonal   Anemia    Anxiety    Blood transfusion without reported diagnosis    years ago   Degenerative cervical disc    Depression    GERD (gastroesophageal reflux disease)    Headache(784.0)    Hypertension     Social History   Socioeconomic History   Marital status: Married    Spouse name: Not on file   Number of children: Not on file   Years of education: Not on file   Highest education level: Not on file  Occupational History   Not on file  Tobacco Use   Smoking status: Never Smoker   Smokeless tobacco: Never Used  Vaping Use   Vaping Use: Never used  Substance and Sexual Activity   Alcohol use: No   Drug use: No   Sexual activity: Not on file  Other Topics Concern   Not on file  Social History Narrative   Not on file   Social Determinants of Health   Financial Resource Strain:    Difficulty of Paying Living Expenses:   Food Insecurity:    Worried About Charity fundraiser in the Last Year:    Arboriculturist in the Last Year:   Transportation Needs:    Film/video editor (Medical):    Lack of Transportation (Non-Medical):     Physical Activity:    Days of Exercise per Week:    Minutes of Exercise per Session:   Stress:    Feeling of Stress :   Social Connections:    Frequency of Communication with Friends and Family:    Frequency of Social Gatherings with Friends and Family:    Attends Religious Services:    Active Member of Clubs or Organizations:    Attends Music therapist:    Marital Status:   Intimate Partner Violence:    Fear of Current or Ex-Partner:    Emotionally Abused:    Physically Abused:    Sexually Abused:     Past Surgical History:  Procedure Laterality Date   COLONOSCOPY     EYE SURGERY     TUBAL LIGATION      Family History  Problem Relation Age of Onset   Heart disease Mother    Alcohol abuse Other    Anxiety disorder Other    Depression Other    Hyperlipidemia Other    Hypertension Other    Colon cancer Neg Hx    Esophageal cancer Neg Hx    Stomach cancer Neg Hx    Rectal cancer Neg Hx     Allergies  Allergen Reactions   Sulfamethoxazole  REACTION: thrush    Current Outpatient Medications on File Prior to Visit  Medication Sig Dispense Refill   aspirin 81 MG tablet Take 81 mg by mouth daily.       calcium citrate-vitamin D (CITRACAL+D) 315-200 MG-UNIT per tablet Take 1 tablet by mouth daily.       conjugated estrogens (PREMARIN) vaginal cream Place 0.5 Applicatorfuls vaginally daily. 42.5 g 11   fish oil-omega-3 fatty acids 1000 MG capsule Take 1 g by mouth daily.       pantoprazole (PROTONIX) 40 MG tablet TAKE 1 TABLET BY MOUTH EVERY DAY 90 tablet 1   sertraline (ZOLOFT) 50 MG tablet TAKE 1 TABLET BY MOUTH EVERY DAY 90 tablet 1   sucralfate (CARAFATE) 1 GM/10ML suspension Take 10 mLs (1 g total) by mouth every 6 (six) hours as needed. 240 mL 3   triamterene-hydrochlorothiazide (DYAZIDE) 37.5-25 MG capsule Take 1 each (1 capsule total) by mouth daily. 90 capsule 1   No current facility-administered medications  on file prior to visit.    BP 110/74    Temp 98.3 F (36.8 C)       Objective:   Physical Exam Vitals and nursing note reviewed.  Constitutional:      Appearance: Normal appearance.  Musculoskeletal:        General: Tenderness present.     Right shoulder: Tenderness and bony tenderness present. No crepitus. Decreased range of motion. Decreased strength.     Comments: Positive back scratch test and can empty test.  Pain against resistance.  Unable to lift arm up over her head  Skin:    General: Skin is warm and dry.     Capillary Refill: Capillary refill takes less than 2 seconds.  Neurological:     General: No focal deficit present.     Mental Status: She is alert and oriented to person, place, and time.  Psychiatric:        Mood and Affect: Mood normal.        Behavior: Behavior normal.        Thought Content: Thought content normal.        Judgment: Judgment normal.       Assessment & Plan:  1. Chronic right shoulder pain -Likely rotator cuff injury.  We discussed options for treatment.  She does not want to have surgery at this time.  Since she responded well to prednisone injection in the past she would like to have another one.   Shoulder injection Verbal consent obtained and verified. Sterile betadine prep. Furthur cleansed with alcohol. Topical analgesic spray: Ethyl chloride. Joint: right  subacromial injection Approached in typical fashion with: posterior approach Completed without difficulty Meds: 3 cc lidocaine 2% no epi, 1 cc depomedrol 80mg /cc Needle:1.5 inch 25 gauge Aftercare instructions and Red flags advised. Immediate improvement in pain noted   - methylPREDNISolone acetate (DEPO-MEDROL) injection 80 mg  Dorothyann Peng, NP

## 2020-04-05 ENCOUNTER — Encounter: Payer: Self-pay | Admitting: Family Medicine

## 2020-04-05 ENCOUNTER — Telehealth (INDEPENDENT_AMBULATORY_CARE_PROVIDER_SITE_OTHER): Payer: Medicare PPO | Admitting: Family Medicine

## 2020-04-05 DIAGNOSIS — R300 Dysuria: Secondary | ICD-10-CM | POA: Diagnosis not present

## 2020-04-05 MED ORDER — NITROFURANTOIN MONOHYD MACRO 100 MG PO CAPS
100.0000 mg | ORAL_CAPSULE | Freq: Two times a day (BID) | ORAL | 0 refills | Status: DC
Start: 2020-04-05 — End: 2020-06-22

## 2020-04-05 NOTE — Patient Instructions (Signed)
-  I sent the medication(s) we discussed to your pharmacy: °Meds ordered this encounter  °Medications  °• nitrofurantoin, macrocrystal-monohydrate, (MACROBID) 100 MG capsule  °  Sig: Take 1 capsule (100 mg total) by mouth 2 (two) times daily.  °  Dispense:  14 capsule  °  Refill:  0  ° ° °Please let us know if you have any questions or concerns regarding this prescription. ° °I hope you are feeling better soon! °Seek care promptly if your symptoms worsen, new concerns arise or you are not improving with treatment. ° °

## 2020-04-05 NOTE — Progress Notes (Signed)
Virtual Visit via Video Note  I connected with Sally Shea  on 04/05/20 at  3:40 PM EDT by a video enabled telemedicine application and verified that I am speaking with the correct person using two identifiers.  Location patient: home, [place] Location provider:work or home office Persons participating in the virtual visit: patient, provider  I discussed the limitations of evaluation and management by telemedicine and the availability of in person appointments. The patient expressed understanding and agreed to proceed.   HPI:  Acute visit for "a UTI": -started a few days ago -symptoms include urinary urgency, frequency -hx of UTIs in the past and feels the same -denies flank pain, abd pain, NVD, fevers, malaise, gross hematuria, malaise, vaginal symptoms -possible allergy to sulfa - but not sure -prefers to treat empirically -reports no abx in last 1 year  ROS: See pertinent positives and negatives per HPI.  Past Medical History:  Diagnosis Date  . Allergy    seasonal  . Anemia   . Anxiety   . Blood transfusion without reported diagnosis    years ago  . Degenerative cervical disc   . Depression   . GERD (gastroesophageal reflux disease)   . Headache(784.0)   . Hypertension     Past Surgical History:  Procedure Laterality Date  . COLONOSCOPY    . EYE SURGERY    . TUBAL LIGATION      Family History  Problem Relation Age of Onset  . Heart disease Mother   . Alcohol abuse Other   . Anxiety disorder Other   . Depression Other   . Hyperlipidemia Other   . Hypertension Other   . Colon cancer Neg Hx   . Esophageal cancer Neg Hx   . Stomach cancer Neg Hx   . Rectal cancer Neg Hx     SOCIAL HX: see hpi   Current Outpatient Medications:  .  aspirin 81 MG tablet, Take 81 mg by mouth daily.  , Disp: , Rfl:  .  calcium citrate-vitamin D (CITRACAL+D) 315-200 MG-UNIT per tablet, Take 1 tablet by mouth daily.  , Disp: , Rfl:  .  conjugated estrogens (PREMARIN) vaginal cream,  Place 0.5 Applicatorfuls vaginally daily., Disp: 42.5 g, Rfl: 11 .  fish oil-omega-3 fatty acids 1000 MG capsule, Take 1 g by mouth daily.  , Disp: , Rfl:  .  pantoprazole (PROTONIX) 40 MG tablet, TAKE 1 TABLET BY MOUTH EVERY DAY, Disp: 90 tablet, Rfl: 1 .  sertraline (ZOLOFT) 50 MG tablet, TAKE 1 TABLET BY MOUTH EVERY DAY, Disp: 90 tablet, Rfl: 1 .  sucralfate (CARAFATE) 1 GM/10ML suspension, Take 10 mLs (1 g total) by mouth every 6 (six) hours as needed., Disp: 240 mL, Rfl: 3 .  triamterene-hydrochlorothiazide (DYAZIDE) 37.5-25 MG capsule, Take 1 each (1 capsule total) by mouth daily., Disp: 90 capsule, Rfl: 1 .  nitrofurantoin, macrocrystal-monohydrate, (MACROBID) 100 MG capsule, Take 1 capsule (100 mg total) by mouth 2 (two) times daily., Disp: 14 capsule, Rfl: 0  EXAM:  VITALS per patient if applicable:  GENERAL: alert, oriented, appears well and in no acute distress  HEENT: atraumatic, conjunttiva clear, no obvious abnormalities on inspection of external nose and ears  NECK: normal movements of the head and neck  LUNGS: on inspection no signs of respiratory distress, breathing rate appears normal, no obvious gross SOB, gasping or wheezing  CV: no obvious cyanosis  MS: moves all visible extremities without noticeable abnormality  PSYCH/NEURO: pleasant and cooperative, no obvious depression or anxiety, speech and  thought processing grossly intact  ASSESSMENT AND PLAN:  Discussed the following assessment and plan:  Dysuria  -we discussed possible serious and likely etiologies, options for evaluation and workup, limitations of telemedicine visit vs in person visit, treatment, treatment risks and precautions. Pt prefers to treat via telemedicine empirically rather then risking or undertaking an in person visit at this moment. Query uncomplicated cystitis. She prefers to try empiric treatment with Macrobid 100mg  bid x 7 days. Denies any kidney or liver disease. Patient agrees to seek  prompt in person care if worsening, new symptoms arise, or if is not improving with treatment.   I discussed the assessment and treatment plan with the patient. The patient was provided an opportunity to ask questions and all were answered. The patient agreed with the plan and demonstrated an understanding of the instructions.   The patient was advised to call back or seek an in-person evaluation if the symptoms worsen or if the condition fails to improve as anticipated.   Lucretia Kern, DO

## 2020-05-09 ENCOUNTER — Other Ambulatory Visit: Payer: Self-pay | Admitting: Adult Health

## 2020-05-09 ENCOUNTER — Other Ambulatory Visit: Payer: Self-pay | Admitting: Gastroenterology

## 2020-05-09 DIAGNOSIS — I10 Essential (primary) hypertension: Secondary | ICD-10-CM

## 2020-05-09 DIAGNOSIS — F329 Major depressive disorder, single episode, unspecified: Secondary | ICD-10-CM

## 2020-06-21 ENCOUNTER — Telehealth: Payer: Self-pay | Admitting: Adult Health

## 2020-06-21 NOTE — Telephone Encounter (Signed)
Patient wants [name] to know she has been having headaches above and behind ear for about 3 months.  She is wondering if she should see [name] or should go to a Garment/textile technologist.

## 2020-06-21 NOTE — Telephone Encounter (Signed)
Neurology would want me to see her first

## 2020-06-21 NOTE — Telephone Encounter (Signed)
Spoke with the pt and informed her of the message below.  Appt scheduled for 10/20 to arrive at 3:15pm.

## 2020-06-22 ENCOUNTER — Encounter: Payer: Self-pay | Admitting: Adult Health

## 2020-06-22 ENCOUNTER — Other Ambulatory Visit: Payer: Self-pay

## 2020-06-22 ENCOUNTER — Ambulatory Visit: Payer: Medicare PPO | Admitting: Adult Health

## 2020-06-22 VITALS — BP 124/80 | HR 97 | Temp 98.4°F | Ht 61.0 in

## 2020-06-22 DIAGNOSIS — R519 Headache, unspecified: Secondary | ICD-10-CM

## 2020-06-22 DIAGNOSIS — G8929 Other chronic pain: Secondary | ICD-10-CM | POA: Diagnosis not present

## 2020-06-22 DIAGNOSIS — Z23 Encounter for immunization: Secondary | ICD-10-CM | POA: Diagnosis not present

## 2020-06-22 MED ORDER — SUCRALFATE 1 GM/10ML PO SUSP: 1.0000 g | Freq: Four times a day (QID) | ORAL | 3 refills | Status: DC | PRN

## 2020-06-22 NOTE — Progress Notes (Signed)
Subjective:    Patient ID: Sally Shea, female    DOB: 1946/06/03, 74 y.o.   MRN: [id number]  HPI  74 year old female who  has a past medical history of Allergy, Anemia, Anxiety, Blood transfusion without reported diagnosis, Degenerative cervical disc, Depression, GERD (gastroesophageal reflux disease), Headache(784.0), and Hypertension.   She presents to the office today for pain behind her right ear as well as frequent headaches.  Per patient report she has had this pain behind her right ear intermittently for the last few years.  She reports that over the last 3 months the pain has been constant discomfort and that she has been experiencing more frontal headaches lately.  She denies pain from her inner ear and denies drainage, fevers, chills, tinnitus, or blurred vision.  She did have an episode recently, she is unsure how recently, but she was driving and had a sudden flash of a bright light in her vision and heard a loud sound.  She did not notice any storm clouds around and is unsure if this came from inside her head or from something on the outside of her car.  She has not been taking much over-the-counter medications as she does not find them helpful for her headaches and pain behind the right ear   Review of Systems See HPI   Past Medical History:  Diagnosis Date   Allergy    seasonal   Anemia    Anxiety    Blood transfusion without reported diagnosis    years ago   Degenerative cervical disc    Depression    GERD (gastroesophageal reflux disease)    Headache(784.0)    Hypertension     Social History   Socioeconomic History   Marital status: Married    Spouse name: Not on file   Number of children: Not on file   Years of education: Not on file   Highest education level: Not on file  Occupational History   Not on file  Tobacco Use   Smoking status: Never Smoker   Smokeless tobacco: Never Used  Vaping Use   Vaping Use: Never used  Substance  and Sexual Activity   Alcohol use: No   Drug use: No   Sexual activity: Not on file  Other Topics Concern   Not on file  Social History Narrative   Not on file   Social Determinants of Health   Financial Resource Strain:    Difficulty of Paying Living Expenses: Not on file  Food Insecurity:    Worried About Running Out of Food in the Last Year: Not on file   Ran Out of Food in the Last Year: Not on file  Transportation Needs:    Lack of Transportation (Medical): Not on file   Lack of Transportation (Non-Medical): Not on file  Physical Activity:    Days of Exercise per Week: Not on file   Minutes of Exercise per Session: Not on file  Stress:    Feeling of Stress : Not on file  Social Connections:    Frequency of Communication with Friends and Family: Not on file   Frequency of Social Gatherings with Friends and Family: Not on file   Attends Religious Services: Not on file   Active Member of Clubs or Organizations: Not on file   Attends Archivist Meetings: Not on file   Marital Status: Not on file  Intimate Partner Violence:    Fear of Current or Ex-Partner: Not on file  Emotionally Abused: Not on file   Physically Abused: Not on file   Sexually Abused: Not on file    Past Surgical History:  Procedure Laterality Date   COLONOSCOPY     EYE SURGERY     TUBAL LIGATION      Family History  Problem Relation Age of Onset   Heart disease Mother    Alcohol abuse Other    Anxiety disorder Other    Depression Other    Hyperlipidemia Other    Hypertension Other    Colon cancer Neg Hx    Esophageal cancer Neg Hx    Stomach cancer Neg Hx    Rectal cancer Neg Hx     Allergies  Allergen Reactions   Sulfamethoxazole     REACTION: thrush    Current Outpatient Medications on File Prior to Visit  Medication Sig Dispense Refill   aspirin 81 MG tablet Take 81 mg by mouth daily.       calcium citrate-vitamin D (CITRACAL+D)  315-200 MG-UNIT per tablet Take 1 tablet by mouth daily.       conjugated estrogens (PREMARIN) vaginal cream Place 0.5 Applicatorfuls vaginally daily. 42.5 g 11   fish oil-omega-3 fatty acids 1000 MG capsule Take 1 g by mouth daily.       pantoprazole (PROTONIX) 40 MG tablet TAKE 1 TABLET BY MOUTH EVERY DAY 90 tablet 0   sertraline (ZOLOFT) 50 MG tablet TAKE 1 TABLET BY MOUTH EVERY DAY 90 tablet 1   triamterene-hydrochlorothiazide (DYAZIDE) 37.5-25 MG capsule TAKE 1 EACH (1 CAPSULE TOTAL) BY MOUTH DAILY. 90 capsule 3   No current facility-administered medications on file prior to visit.    BP 124/80 (BP Location: Left Arm, Patient Position: Sitting, Cuff Size: Normal)    Pulse 97    Temp 98.4 F (36.9 C) (Oral)    Ht 5\' 1"  (1.549 m)    SpO2 94%    BMI 23.05 kg/m       Objective:   Physical Exam Vitals and nursing note reviewed.  Constitutional:      Appearance: Normal appearance.  HENT:     Head: Normocephalic and atraumatic.      Comments: No redness, warmth or swelling noted     Right Ear: Tympanic membrane, ear canal and external ear normal. There is no impacted cerumen.     Left Ear: Tympanic membrane, ear canal and external ear normal. There is no impacted cerumen.     Nose: Nose normal. No congestion or rhinorrhea.     Mouth/Throat:     Mouth: Mucous membranes are moist.     Pharynx: Oropharynx is clear.  Eyes:     General:        Right eye: No discharge.        Left eye: No discharge.     Extraocular Movements: Extraocular movements intact.     Conjunctiva/sclera: Conjunctivae normal.     Pupils: Pupils are equal, round, and reactive to light.  Musculoskeletal:        General: Normal range of motion.  Skin:    General: Skin is warm and dry.     Capillary Refill: Capillary refill takes less than 2 seconds.  Neurological:     General: No focal deficit present.     Mental Status: She is alert and oriented to person, place, and time.  Psychiatric:        Mood  and Affect: Mood normal.        Behavior: Behavior normal.  Thought Content: Thought content normal.        Judgment: Judgment normal.           Assessment & Plan:  1. Chronic nonintractable headache, unspecified headache type -Doubt mastoiditis due to timeframe.  Due to complaints as well as chronic headache x3 months, field of vision changes, and age, will get  MRI of brain and further evaluate  - MR Brain Wo Contrast; Future  2. Need for influenza vaccination  - Flu Vaccine QUAD High Dose(Fluad)  Dorothyann Peng, NP

## 2020-07-11 ENCOUNTER — Ambulatory Visit
Admission: RE | Admit: 2020-07-11 | Discharge: 2020-07-11 | Disposition: A | Discharge status: Home / Self Care | Payer: Medicare PPO | Source: Ambulatory Visit | Attending: Adult Health | Admitting: Adult Health

## 2020-07-11 DIAGNOSIS — G8929 Other chronic pain: Secondary | ICD-10-CM

## 2020-07-11 DIAGNOSIS — I6782 Cerebral ischemia: Secondary | ICD-10-CM | POA: Diagnosis not present

## 2020-07-11 DIAGNOSIS — G9389 Other specified disorders of brain: Secondary | ICD-10-CM | POA: Diagnosis not present

## 2020-07-11 DIAGNOSIS — H748X2 Other specified disorders of left middle ear and mastoid: Secondary | ICD-10-CM | POA: Diagnosis not present

## 2020-07-11 DIAGNOSIS — G319 Degenerative disease of nervous system, unspecified: Secondary | ICD-10-CM | POA: Diagnosis not present

## 2020-07-12 ENCOUNTER — Telehealth: Payer: Self-pay | Admitting: Adult Health

## 2020-07-12 ENCOUNTER — Telehealth: Payer: Self-pay

## 2020-07-12 ENCOUNTER — Other Ambulatory Visit: Payer: Medicare PPO

## 2020-07-12 ENCOUNTER — Other Ambulatory Visit: Payer: Self-pay | Admitting: Adult Health

## 2020-07-12 DIAGNOSIS — E1169 Type 2 diabetes mellitus with other specified complication: Secondary | ICD-10-CM

## 2020-07-12 DIAGNOSIS — G9389 Other specified disorders of brain: Secondary | ICD-10-CM

## 2020-07-12 DIAGNOSIS — E785 Hyperlipidemia, unspecified: Secondary | ICD-10-CM

## 2020-07-12 DIAGNOSIS — M25511 Pain in right shoulder: Secondary | ICD-10-CM

## 2020-07-12 MED ORDER — ROSUVASTATIN CALCIUM 20 MG PO TABS: 20.0000 mg | ORAL_TABLET | Freq: Every day | ORAL | 3 refills | Status: DC

## 2020-07-12 NOTE — Telephone Encounter (Signed)
Spoke  to patient and informed her of her recent MRI which showed  IMPRESSION: 1.8 cm right frontal convexity mass. Postcontrast MRI is recommended for further evaluation.  No acute infarct or intracranial hemorrhage. Chronic bilateral basal ganglia lacunar insults.  Moderate cerebral atrophy. Advanced chronic microvascular ischemic Changes.  We will order postcontrast MRI, refer her over to neurosurgery, she would like to see Dr. Ellene Route.  Start her on statin medication.  She also continues to have pain in the right shoulder and would like to be referred to physical therapy

## 2020-07-12 NOTE — Telephone Encounter (Signed)
Received a call from Oliv[place] at [facility]. Please see MRI results from yesterday

## 2020-07-15 ENCOUNTER — Other Ambulatory Visit: Payer: Self-pay | Admitting: Adult Health

## 2020-07-15 DIAGNOSIS — G319 Degenerative disease of nervous system, unspecified: Secondary | ICD-10-CM

## 2020-07-22 ENCOUNTER — Ambulatory Visit (INDEPENDENT_AMBULATORY_CARE_PROVIDER_SITE_OTHER): Payer: Medicare PPO | Admitting: Adult Health

## 2020-07-22 ENCOUNTER — Other Ambulatory Visit: Payer: Self-pay

## 2020-07-22 ENCOUNTER — Encounter: Payer: Self-pay | Admitting: Adult Health

## 2020-07-22 VITALS — BP 138/82 | HR 61 | Temp 97.8°F | Ht 61.0 in | Wt 125.6 lb

## 2020-07-22 DIAGNOSIS — E1169 Type 2 diabetes mellitus with other specified complication: Secondary | ICD-10-CM

## 2020-07-22 DIAGNOSIS — Z0000 Encounter for general adult medical examination without abnormal findings: Secondary | ICD-10-CM | POA: Diagnosis not present

## 2020-07-22 DIAGNOSIS — K219 Gastro-esophageal reflux disease without esophagitis: Secondary | ICD-10-CM | POA: Diagnosis not present

## 2020-07-22 DIAGNOSIS — G9389 Other specified disorders of brain: Secondary | ICD-10-CM | POA: Diagnosis not present

## 2020-07-22 DIAGNOSIS — G319 Degenerative disease of nervous system, unspecified: Secondary | ICD-10-CM

## 2020-07-22 DIAGNOSIS — F32A Depression, unspecified: Secondary | ICD-10-CM | POA: Diagnosis not present

## 2020-07-22 DIAGNOSIS — E785 Hyperlipidemia, unspecified: Secondary | ICD-10-CM | POA: Diagnosis not present

## 2020-07-22 DIAGNOSIS — F419 Anxiety disorder, unspecified: Secondary | ICD-10-CM

## 2020-07-22 DIAGNOSIS — I10 Essential (primary) hypertension: Secondary | ICD-10-CM | POA: Diagnosis not present

## 2020-07-22 NOTE — Progress Notes (Signed)
Subjective:    Patient ID: Sally Shea, female    DOB: 05-11-1946, 74 y.o.   MRN: [id number]  HPI Patient presents for yearly preventative medicine examination. She is a pleasant 74 year old female who  has a past medical history of Allergy, Anemia, Anxiety, Blood transfusion without reported diagnosis, Degenerative cervical disc, Depression, GERD (gastroesophageal reflux disease), Headache(784.0), and Hypertension.  Hypertension -takes Dyazide 37.5-25 mg daily.  She denies dizziness, lightheadedness, chest pain, shortness of breath, or syncopal episodes BP Readings from Last 3 Encounters:  07/22/20 138/82  06/22/20 124/80  03/30/20 110/74   Anxiety/Depression-she is well controlled with Zoloft 50 mg  GERD-currently on Protonix 40 mg daily.  Feels well controlled  Hyperlipidemia -was placed on Crestor 20 mg after her last CPE. She started this last week.  She denies myalgia or fatigue Lab Results  Component Value Date   CHOL 230 (H) 07/22/2019   HDL 53.80 07/22/2019   LDLCALC 153 (H) 07/22/2019   LDLDIRECT 176.0 06/13/2011   TRIG 117.0 07/22/2019   CHOLHDL 4 07/22/2019   Abnormal MRI of head -earlier this month she was complaining of right-sided headache and right sided ear pain that had been happening intermittently for the last few years but over the 3 months prior the pain had been consistent discomfort.  An MRI of the brain showed  IMPRESSION: 1.8 cm right frontal convexity mass. Postcontrast MRI is recommended for further evaluation.  No acute infarct or intracranial hemorrhage. Chronic bilateral basal ganglia lacunar insults.  Moderate cerebral atrophy. Advanced chronic microvascular ischemic changes.  She has an appointment in a week and a half for repeat MRI of the brain with and without contrast, as it has been scheduled with neurosurgery as well as neurology. She denies recent headaches or blurred vision.    All immunizations and health maintenance protocols  were reviewed with the patient and needed orders were placed.  Appropriate screening laboratory values were ordered for the patient including screening of hyperlipidemia, renal function and hepatic function.  Medication reconciliation,  past medical history, social history, problem list and allergies were reviewed in detail with the patient  Goals were established with regard to weight loss, exercise, and  diet in compliance with medications.  She does eat healthy and stays active Wt Readings from Last 3 Encounters:  07/22/20 125 lb 9.6 oz (57 kg)  10/30/19 122 lb (55.3 kg)  07/22/19 122 lb (55.3 kg)   He is up-to-date on routine colon cancer screening, mammograms, dental and vision screens  Review of Systems  Constitutional: Negative.   HENT: Negative.   Eyes: Negative.   Respiratory: Negative.   Cardiovascular: Negative.   Gastrointestinal: Negative.   Endocrine: Negative.   Genitourinary: Negative.   Musculoskeletal: Positive for arthralgias.  Skin: Negative.   Allergic/Immunologic: Negative.   Neurological: Positive for headaches.  Hematological: Negative.   Psychiatric/Behavioral: Negative.    Past Medical History:  Diagnosis Date  . Allergy    seasonal  . Anemia   . Anxiety   . Blood transfusion without reported diagnosis    years ago  . Degenerative cervical disc   . Depression   . GERD (gastroesophageal reflux disease)   . Headache(784.0)   . Hypertension     Social History   Socioeconomic History  . Marital status: Married    Spouse name: Not on file  . Number of children: Not on file  . Years of education: Not on file  . Highest education level: Not on  file  Occupational History  . Not on file  Tobacco Use  . Smoking status: Never Smoker  . Smokeless tobacco: Never Used  Vaping Use  . Vaping Use: Never used  Substance and Sexual Activity  . Alcohol use: No  . Drug use: No  . Sexual activity: Not on file  Other Topics Concern  . Not on file    Social History Narrative  . Not on file   Social Determinants of Health   Financial Resource Strain:   . Difficulty of Paying Living Expenses: Not on file  Food Insecurity:   . Worried About Charity fundraiser in the Last Year: Not on file  . Ran Out of Food in the Last Year: Not on file  Transportation Needs:   . Lack of Transportation (Medical): Not on file  . Lack of Transportation (Non-Medical): Not on file  Physical Activity:   . Days of Exercise per Week: Not on file  . Minutes of Exercise per Session: Not on file  Stress:   . Feeling of Stress : Not on file  Social Connections:   . Frequency of Communication with Friends and Family: Not on file  . Frequency of Social Gatherings with Friends and Family: Not on file  . Attends Religious Services: Not on file  . Active Member of Clubs or Organizations: Not on file  . Attends Archivist Meetings: Not on file  . Marital Status: Not on file  Intimate Partner Violence:   . Fear of Current or Ex-Partner: Not on file  . Emotionally Abused: Not on file  . Physically Abused: Not on file  . Sexually Abused: Not on file    Past Surgical History:  Procedure Laterality Date  . COLONOSCOPY    . EYE SURGERY    . TUBAL LIGATION      Family History  Problem Relation Age of Onset  . Heart disease Mother   . Alcohol abuse Other   . Anxiety disorder Other   . Depression Other   . Hyperlipidemia Other   . Hypertension Other   . Colon cancer Neg Hx   . Esophageal cancer Neg Hx   . Stomach cancer Neg Hx   . Rectal cancer Neg Hx     Allergies  Allergen Reactions  . Sulfamethoxazole     REACTION: thrush    Current Outpatient Medications on File Prior to Visit  Medication Sig Dispense Refill  . aspirin 81 MG tablet Take 81 mg by mouth daily.      . calcium citrate-vitamin D (CITRACAL+D) 315-200 MG-UNIT per tablet Take 1 tablet by mouth daily.      Marland Kitchen conjugated estrogens (PREMARIN) vaginal cream Place 0.5  Applicatorfuls vaginally daily. 42.5 g 11  . fish oil-omega-3 fatty acids 1000 MG capsule Take 1 g by mouth daily.      . pantoprazole (PROTONIX) 40 MG tablet TAKE 1 TABLET BY MOUTH EVERY DAY 90 tablet 0  . rosuvastatin (CRESTOR) 20 MG tablet Take 1 tablet (20 mg total) by mouth daily. 90 tablet 3  . sertraline (ZOLOFT) 50 MG tablet TAKE 1 TABLET BY MOUTH EVERY DAY 90 tablet 1  . sucralfate (CARAFATE) 1 GM/10ML suspension Take 10 mLs (1 g total) by mouth every 6 (six) hours as needed. 240 mL 3  . triamterene-hydrochlorothiazide (DYAZIDE) 37.5-25 MG capsule TAKE 1 EACH (1 CAPSULE TOTAL) BY MOUTH DAILY. 90 capsule 3   No current facility-administered medications on file prior to visit.  BP 138/82   Pulse 61   Temp 97.8 F (36.6 C) (Oral)   Ht 5' 1"  (1.549 m)   Wt 125 lb 9.6 oz (57 kg)   SpO2 97%   BMI 23.73 kg/m       Objective:   Physical Exam Vitals and nursing note reviewed.  Constitutional:      General: She is not in acute distress.    Appearance: Normal appearance. She is well-developed. She is not ill-appearing.  HENT:     Head: Normocephalic and atraumatic.     Right Ear: Tympanic membrane, ear canal and external ear normal. There is no impacted cerumen.     Left Ear: Tympanic membrane, ear canal and external ear normal. There is no impacted cerumen.     Nose: Nose normal. No congestion or rhinorrhea.     Mouth/Throat:     Mouth: Mucous membranes are moist.     Pharynx: Oropharynx is clear. No oropharyngeal exudate or posterior oropharyngeal erythema.  Eyes:     General:        Right eye: No discharge.        Left eye: No discharge.     Extraocular Movements: Extraocular movements intact.     Conjunctiva/sclera: Conjunctivae normal.     Pupils: Pupils are equal, round, and reactive to light.  Neck:     Thyroid: No thyromegaly.     Vascular: No carotid bruit.     Trachea: No tracheal deviation.  Cardiovascular:     Rate and Rhythm: Normal rate and regular  rhythm.     Pulses: Normal pulses.     Heart sounds: Normal heart sounds. No murmur heard.  No friction rub. No gallop.   Pulmonary:     Effort: Pulmonary effort is normal. No respiratory distress.     Breath sounds: Normal breath sounds. No stridor. No wheezing, rhonchi or rales.  Chest:     Chest wall: No tenderness.  Abdominal:     General: Abdomen is flat. Bowel sounds are normal. There is no distension.     Palpations: Abdomen is soft. There is no mass.     Tenderness: There is no abdominal tenderness. There is no right CVA tenderness, left CVA tenderness, guarding or rebound.     Hernia: No hernia is present.  Musculoskeletal:        General: No swelling, tenderness, deformity or signs of injury. Normal range of motion.     Cervical back: Normal range of motion and neck supple.     Right lower leg: No edema.     Left lower leg: No edema.  Lymphadenopathy:     Cervical: No cervical adenopathy.  Skin:    General: Skin is warm and dry.     Coloration: Skin is not jaundiced or pale.     Findings: No bruising, erythema, lesion or rash.  Neurological:     General: No focal deficit present.     Mental Status: She is alert and oriented to person, place, and time.     Cranial Nerves: No cranial nerve deficit.     Sensory: No sensory deficit.     Motor: No weakness.     Coordination: Coordination normal.     Gait: Gait normal.     Deep Tendon Reflexes: Reflexes normal.  Psychiatric:        Mood and Affect: Mood normal.        Behavior: Behavior normal.        Thought Content: Thought  content normal.        Judgment: Judgment normal.       Assessment & Plan:  1. Routine general medical examination at a health care facility - Continue to stay active and eat healthy  - CBC with Differential/Platelet; Future - Lipid panel; Future - TSH; Future - CMP with eGFR([facility]); Future - CBC with Differential/Platelet - Lipid panel - TSH - CMP with eGFR([facility])  2. Essential  hypertension - No change in medication dosing  - CBC with Differential/Platelet; Future - Lipid panel; Future - TSH; Future - CMP with eGFR([facility]); Future - CBC with Differential/Platelet - Lipid panel - TSH - CMP with eGFR([facility])  3. Hyperlipidemia associated with type 2 diabetes mellitus ([place]) - likely no change in lipid panel since she just started medications - CBC with Differential/Platelet; Future - Lipid panel; Future - TSH; Future - CMP with eGFR([facility]); Future - CBC with Differential/Platelet - Lipid panel - TSH - CMP with eGFR([facility])  4. Anxiety and depression - Well controlled.   5. Gastroesophageal reflux disease without esophagitis - Continue with Protonix daily   6. Frontal mass of brain - Follow up for repeat MRI and Neurosurgery as directed  7. Cerebral atrophy ([place]) - Follow up with Neurology as directed - Vitamin B12; Future - Vitamin B12   Dorothyann Peng, NP

## 2020-07-23 LAB — COMPLETE METABOLIC PANEL WITH GFR
AG Ratio: 1.8 (calc) (ref 1.0–2.5)
ALT: 19 U/L (ref 6–29)
AST: 22 U/L (ref 10–35)
Albumin: 4.8 g/dL (ref 3.6–5.1)
Alkaline phosphatase (APISO): 71 U/L (ref 37–153)
BUN/Creatinine Ratio: 22 (calc) (ref 6–22)
BUN: 21 mg/dL (ref 7–25)
CO2: 30 mmol/L (ref 20–32)
Calcium: 10.2 mg/dL (ref 8.6–10.4)
Chloride: 102 mmol/L (ref 98–110)
Creat: 0.95 mg/dL — ABNORMAL HIGH (ref 0.60–0.93)
GFR, Est African American: 69 mL/min/{1.73_m2} (ref >=60)
GFR, Est Non African American: 59 mL/min/{1.73_m2} — ABNORMAL LOW (ref >=60)
Globulin: 2.6 g/dL (calc) (ref 1.9–3.7)
Glucose, Bld: 93 mg/dL (ref 65–99)
Potassium: 4.8 mmol/L (ref 3.5–5.3)
Sodium: 141 mmol/L (ref 135–146)
Total Bilirubin: 0.4 mg/dL (ref 0.2–1.2)
Total Protein: 7.4 g/dL (ref 6.1–8.1)

## 2020-07-23 LAB — LIPID PANEL
Cholesterol: 177 mg/dL (ref <200)
HDL: 67 mg/dL (ref >=50)
LDL Cholesterol (Calc): 95 mg/dL (calc)
Non-HDL Cholesterol (Calc): 110 mg/dL (calc) (ref <130)
Total CHOL/HDL Ratio: 2.6 (calc) (ref <5.0)
Triglycerides: 65 mg/dL (ref <150)

## 2020-07-23 LAB — CBC WITH DIFFERENTIAL/PLATELET
Absolute Monocytes: 489 cells/uL (ref 200–950)
Basophils Absolute: 68 cells/uL (ref 0–200)
Basophils Relative: 1.3 %
Eosinophils Absolute: 187 cells/uL (ref 15–500)
Eosinophils Relative: 3.6 %
HCT: 42.5 % (ref 35.0–45.0)
Hemoglobin: 14.2 g/dL (ref 11.7–15.5)
Lymphs Abs: 1862 cells/uL (ref 850–3900)
MCH: 30.8 pg (ref 27.0–33.0)
MCHC: 33.4 g/dL (ref 32.0–36.0)
MCV: 92.2 fL (ref 80.0–100.0)
MPV: 9.8 fL (ref 7.5–12.5)
Monocytes Relative: 9.4 %
Neutro Abs: 2595 cells/uL (ref 1500–7800)
Neutrophils Relative %: 49.9 %
Platelets: 359 10*3/uL (ref 140–400)
RBC: 4.61 10*6/uL (ref 3.80–5.10)
RDW: 12.2 % (ref 11.0–15.0)
Total Lymphocyte: 35.8 %
WBC: 5.2 10*3/uL (ref 3.8–10.8)

## 2020-07-23 LAB — VITAMIN B12: Vitamin B-12: 687 pg/mL (ref 200–1100)

## 2020-07-23 LAB — TSH: TSH: 2.41 mIU/L (ref 0.40–4.50)

## 2020-07-31 ENCOUNTER — Other Ambulatory Visit: Payer: Self-pay

## 2020-07-31 ENCOUNTER — Ambulatory Visit
Admission: RE | Admit: 2020-07-31 | Discharge: 2020-07-31 | Disposition: A | Discharge status: Home / Self Care | Payer: Medicare PPO | Source: Ambulatory Visit | Attending: Adult Health | Admitting: Adult Health

## 2020-07-31 DIAGNOSIS — G939 Disorder of brain, unspecified: Secondary | ICD-10-CM | POA: Diagnosis not present

## 2020-07-31 DIAGNOSIS — R220 Localized swelling, mass and lump, head: Secondary | ICD-10-CM | POA: Diagnosis not present

## 2020-07-31 DIAGNOSIS — D329 Benign neoplasm of meninges, unspecified: Secondary | ICD-10-CM | POA: Diagnosis not present

## 2020-07-31 DIAGNOSIS — G9389 Other specified disorders of brain: Secondary | ICD-10-CM

## 2020-07-31 MED ORDER — GADOBENATE DIMEGLUMINE 529 MG/ML IV SOLN
11.0000 mL | Freq: Once | INTRAVENOUS | Status: AC | PRN
  Administered 2020-07-31: 11 mL via INTRAVENOUS

## 2020-08-09 DIAGNOSIS — D496 Neoplasm of unspecified behavior of brain: Secondary | ICD-10-CM | POA: Diagnosis not present

## 2020-08-28 ENCOUNTER — Other Ambulatory Visit: Payer: Self-pay | Admitting: Gastroenterology

## 2020-09-20 ENCOUNTER — Encounter: Payer: Self-pay | Admitting: Adult Health

## 2020-09-21 DIAGNOSIS — D320 Benign neoplasm of cerebral meninges: Secondary | ICD-10-CM | POA: Diagnosis not present

## 2020-09-21 DIAGNOSIS — Z6832 Body mass index (BMI) 32.0-32.9, adult: Secondary | ICD-10-CM | POA: Diagnosis not present

## 2020-09-28 ENCOUNTER — Ambulatory Visit: Payer: Self-pay | Admitting: Neurology

## 2020-09-29 ENCOUNTER — Ambulatory Visit: Payer: Medicare PPO | Attending: Adult Health | Admitting: Physical Therapy

## 2020-09-29 ENCOUNTER — Other Ambulatory Visit: Payer: Self-pay

## 2020-09-29 ENCOUNTER — Encounter: Payer: Self-pay | Admitting: Physical Therapy

## 2020-09-29 DIAGNOSIS — R29898 Other symptoms and signs involving the musculoskeletal system: Secondary | ICD-10-CM | POA: Diagnosis not present

## 2020-09-29 DIAGNOSIS — M25511 Pain in right shoulder: Secondary | ICD-10-CM | POA: Diagnosis not present

## 2020-09-29 DIAGNOSIS — M25611 Stiffness of right shoulder, not elsewhere classified: Secondary | ICD-10-CM | POA: Insufficient documentation

## 2020-09-29 NOTE — Therapy (Signed)
[facility] [address] [place], [place], [address] Phone: [phone]   Fax:  [phone]  Physical Therapy Evaluation  Patient Details  Name: Sally Shea MRN: [id number] Date of Birth: 05-23-1946 Referring Provider (PT): Murtis Sink   Encounter Date: 09/29/2020   PT End of Session - 09/29/20 1542    Visit Number 1    Number of Visits 16    Date for PT Re-Evaluation 11/24/20    Authorization Type evaluation: 1/27    Authorization - Visit Number 1    Authorization - Number of Visits 10    PT Start Time 3614    PT Stop Time 1411    PT Time Calculation (min) 56 min    Activity Tolerance Patient tolerated treatment well    Behavior During Therapy [facility] for tasks assessed/performed           Past Medical History:  Diagnosis Date  . Allergy    seasonal  . Anemia   . Anxiety   . Blood transfusion without reported diagnosis    years ago  . Degenerative cervical disc   . Depression   . GERD (gastroesophageal reflux disease)   . Headache(784.0)   . Hypertension     Past Surgical History:  Procedure Laterality Date  . COLONOSCOPY    . EYE SURGERY    . TUBAL LIGATION      There were no vitals filed for this visit.    Subjective Assessment - 09/29/20 1516    Subjective Pt states she has been having soreness in her R shoulder that started after falling a few times, first fall was 8 years ago. She reports some tightness, decreased motion, and decreased strength throughout both arms. Pt currently having 0/10 pain. At worst her pain gets up to a 5/10 and that is with lifting, reaching behind the back, pushing or pulling, turning steering wheel. Pt states she uses a heat pad to decrease her pain. Her sleep is not distrubed at night. Her pain is greatest in the morning and increases with activity as the day progresses. No radiating pain and no N/T reported.    Pertinent History Injection in the R shoulder last year. Hx of  falls onto the R shoulder over the last 8 years. X-rays done on the R shoulder and no significant findings    Limitations Lifting    Patient Stated Goals Pt would like to get back to weight lifting, tennis and pickle ball, and have decreased pain with reaching    Currently in Pain? No/denies              [facility] PT Assessment - 09/29/20 0001      Assessment   Medical Diagnosis acute pain of right shoulder    Referring Provider (PT) Murtis Sink    Onset Date/Surgical Date 09/04/19    Hand Dominance Right    Prior Therapy Unknown      Home Environment   Living Environment Private residence      Prior Function   Level of Independence Independent      Cognition   Overall Cognitive Status Within Functional Limits for tasks assessed      AROM   Overall AROM Comments abd and ER painful    Right/Left Shoulder Right    Right Shoulder Flexion 175 Degrees    Right Shoulder ABduction 180 Degrees    Right Shoulder Internal Rotation 65 Degrees    Right Shoulder External Rotation 75 Degrees  Left Shoulder Flexion 165 Degrees    Left Shoulder ABduction 145 Degrees    Left Shoulder Internal Rotation 70 Degrees    Left Shoulder External Rotation 90 Degrees    Left Elbow Flexion 5    Left Elbow Extension 5      PROM   Overall PROM  Within functional limits for tasks performed    Overall PROM Comments painful R shoulder IR and ER      Strength   Overall Strength Comments pain: B flexion    Right Shoulder Flexion 4/5    Right Shoulder Extension 4/5    Right Shoulder ABduction 5/5    Right Shoulder Internal Rotation 4+/5    Right Shoulder External Rotation 4-/5    Right Shoulder Horizontal ABduction 4/5    Right Shoulder Horizontal ADduction 4+/5    Left Shoulder Flexion 4/5    Left Shoulder Extension 4/5    Left Shoulder ABduction 5/5    Left Shoulder Internal Rotation 4+/5    Left Shoulder External Rotation 4/5    Left Shoulder Horizontal ABduction 4/5    Left Shoulder  Horizontal ADduction 4+/5    Right Elbow Flexion 5/5    Right Elbow Extension 5/5    Left Elbow Flexion 5/5    Left Elbow Extension 5/5      Palpation   Palpation comment TTP of R coracoid process, R ACJ,           OBJECTIVE  MUSCULOSKELETAL: Tremor: Normal Bulk: Normal Tone: Normal  Cervical Screen AROM: WFL and painless with overpressure in all planes Spurlings A (ipsilateral lateral flexion/axial compression): B: negative  Spurlings B (ipsilateral lateral flexion/contralateral rotation/axial compression): B: negative    Elbow Screen Elbow AROM: WNL    Objective measurements completed on examination: See above findings.     Next visit: assess grip strength, triceps and bicep flexibility, and PAM.  Assess for impingement and RTC pathology      PT Education - 09/29/20 1541    Education Details Pt educated on findings of deficits, POC, and HEP.    Person(s) Educated Patient    Methods Explanation;Demonstration    Comprehension Verbalized understanding;Returned demonstration               PT Long Term Goals - 09/29/20 1552      PT LONG TERM GOAL #1   Title Pt will increase B shoulder strength to at least 4+/5 with no pain    Baseline 1/27: shoulder flexion, mid trap, and shoulder flexion all below 4+/5 and painful currently    Time 8    Period Weeks    Status New    Target Date 11/24/20      PT LONG TERM GOAL #2   Title Pt will increased FOTO score to 68 to improve functional mobility .    Baseline 1/27: 54    Time 8    Period Weeks    Status New    Target Date 11/24/20      PT LONG TERM GOAL #3   Title Pt will increase R shoulder ER and IR to 90 deg ER and 70 deg IR in AROM to improve ADL.    Baseline 1/27: 75 ER and 65 IR AROM    Time 8    Period Weeks    Status New    Target Date 11/24/20      PT LONG TERM GOAL #4   Title Pt. will carry >10# object with no increase c/o R shoulder  pain or limitations to improve household tasks.    Baseline  Pt. reports difficulty and pain in R shoulder with carrying/ lifting objects.    Time 8    Period Weeks    Status New    Target Date 11/24/20                  Plan - 09/29/20 1543    Clinical Impression Statement Pt is a 75 year-old female referred for R shoulder pain. PT examination reveals deficits in R shoulder and scapular strength and R shoulder ROM ( R shoudler ER MMT 4-/5 and painful, B shoulder flexion MMT 4/5 and painful, B mid trap 4/5 and painful, R shoulder ER AROM 75 deg, IR AROM 65 deg).  See objective measurements in note.  Pt will benefit from PT services to address deficits in strength, mobility, and pain in order to return to full function at home with less shoulder pain.    Examination-Activity Limitations Caring for [employer]    Examination-Participation Restrictions Cleaning;Community Activity;Driving;Laundry    Stability/Clinical Decision Making Evolving/Moderate complexity    Clinical Decision Making Moderate    Rehab Potential Good    PT Frequency 2x / week    PT Duration 8 weeks    PT Treatment/Interventions Cryotherapy;Electrical Stimulation;Moist Heat;Therapeutic activities;Therapeutic exercise;Neuromuscular re-education;Manual techniques;Passive range of motion;ADLs/Self Care Home Management;Functional mobility training;Patient/family education    PT Next Visit Plan Assess grip strength and tricep and bicep flexibility.  PT will reassess special tests to R shoulder.    PT Home Exercise Plan [email or web address] - R shoulder ER isometrics    Consulted and Agree with Plan of Care Patient           Patient will benefit from skilled therapeutic intervention in order to improve the following deficits and impairments:  Decreased range of motion,Decreased endurance,Impaired UE functional use,Decreased mobility,Decreased strength,Pain,Decreased activity tolerance  Visit Diagnosis: Acute pain of right shoulder  Impaired strength of  shoulder muscles  Shoulder joint stiffness, right     Problem List Patient Active Problem List   Diagnosis Date Noted  . Acute cystitis with hematuria 09/25/2017  . Breast pain, left 07/02/2017  . Allergic rhinitis due to pollen 05/22/2012  . Senile atrophic vaginitis 06/20/2011  . Depression 01/27/2008  . Essential hypertension 01/27/2008   Pura Spice, PT, DPT # [id number] Kayleen Memos, SPT 09/29/2020, 4:33 PM  [facility] [address] [place], [place], [address] Phone: [phone]   Fax:  [phone]  Name: LATANGELA PENATE MRN: [id number] Date of Birth: 04/21/1946

## 2020-09-29 NOTE — Patient Instructions (Signed)
Access Code: [email or web address]: [email or web address]: 01/27/2022Prepared by: Legrand Como SherkExercises  Isometric Shoulder External Rotation at [employer] - 2 x daily - 7 x weekly - 1 sets - 10 reps - 5 hold

## 2020-10-03 ENCOUNTER — Encounter: Payer: Self-pay | Admitting: Physical Therapy

## 2020-10-03 ENCOUNTER — Other Ambulatory Visit: Payer: Self-pay

## 2020-10-03 ENCOUNTER — Ambulatory Visit: Payer: Medicare PPO | Admitting: Physical Therapy

## 2020-10-03 DIAGNOSIS — M25611 Stiffness of right shoulder, not elsewhere classified: Secondary | ICD-10-CM

## 2020-10-03 DIAGNOSIS — R29898 Other symptoms and signs involving the musculoskeletal system: Secondary | ICD-10-CM | POA: Diagnosis not present

## 2020-10-03 DIAGNOSIS — M25511 Pain in right shoulder: Secondary | ICD-10-CM | POA: Diagnosis not present

## 2020-10-03 NOTE — Therapy (Signed)
[facility] [address] [place], [place], [address] Phone: [phone]   Fax:  [phone]  Physical Therapy Treatment  Patient Details  Name: Sally Shea MRN: [id number] Date of Birth: [birth year] Referring Provider (PT): Murtis Sink   Encounter Date: 10/03/2020   PT End of Session - 10/03/20 1141    Visit Number 2    Number of Visits 16    Date for PT Re-Evaluation 11/24/20    Authorization Type evaluation: 1/27    Authorization - Visit Number 2    Authorization - Number of Visits 10    PT Start Time 1540    PT Stop Time 1121    PT Time Calculation (min) 49 min    Activity Tolerance Patient tolerated treatment well    Behavior During Therapy [facility] for tasks assessed/performed           Past Medical History:  Diagnosis Date  . Allergy    seasonal  . Anemia   . Anxiety   . Blood transfusion without reported diagnosis    years ago  . Degenerative cervical disc   . Depression   . GERD (gastroesophageal reflux disease)   . Headache(784.0)   . Hypertension     Past Surgical History:  Procedure Laterality Date  . COLONOSCOPY    . EYE SURGERY    . TUBAL LIGATION      There were no vitals filed for this visit.   Subjective Assessment - 10/03/20 1035    Subjective Pt states she has no pain in her R shoulder at rest, but gets increased pain in her R shoulder and upper arm with OH reaching.    Pertinent History Injection in the R shoulder last year. Hx of falls onto the R shoulder over the last 8 years. X-rays done on the R shoulder and no significant findings    Limitations Lifting    Patient Stated Goals Pt would like to get back to weight lifting, tennis and pickle ball, and have decreased pain with reaching    Currently in Pain? No/denies            Assessment:   Lateral jobe, empty can, ER lag sign, and lift off test: all negative  + passive compression test  Assessed PAM of R shoulder today -  posterior and inferior glides.    There ex:   punch ups 2x10 (increase next visit)  SL ER 2x10 (increase next visit)   Standing shoulder abd and flexion with 2# DB   Nautilus 20# 2x10 each:  Lat pull down  Rows    Horizontal abd red TB 2x10 (Added to HEP)  B shoulder ER red TB 2x10 (added to HEP)        PT Long Term Goals - 09/29/20 1552      PT LONG TERM GOAL #1   Title Pt will increase B shoulder strength to at least 4+/5 with no pain    Baseline 1/27: shoulder flexion, mid trap, and shoulder flexion all below 4+/5 and painful currently    Time 8    Period Weeks    Status New    Target Date 11/24/20      PT LONG TERM GOAL #2   Title Pt will increased FOTO score to 68 to improve functional mobility .    Baseline 1/27: 54    Time 8    Period Weeks    Status New    Target Date 11/24/20  PT LONG TERM GOAL #3   Title Pt will increase R shoulder ER and IR to 90 deg ER and 70 deg IR in AROM to improve ADL.    Baseline 1/27: 75 ER and 65 IR AROM    Time 8    Period Weeks    Status New    Target Date 11/24/20      PT LONG TERM GOAL #4   Title Pt. will carry >10# object with no increase c/o R shoulder pain or limitations to improve household tasks.    Baseline Pt. reports difficulty and pain in R shoulder with carrying/ lifting objects.    Time 8    Period Weeks    Status New    Target Date 11/24/20                 Plan - 10/03/20 1142    Clinical Impression Statement Pt demos deficits in R shoulder ROM and strength and resulting in difficulty with functional reaching tasks. Pt also presented with a positive passive compression test today evident by pain reported with motion. Pt reported pain with R PA GH joint mobs and no pain with R inf GH glides.  Initiated strengthening exercises today and pt tolerated well with no c/o increased pain.    Examination-Activity Limitations Caring for [employer]     Examination-Participation Restrictions Cleaning;Community Activity;Driving;Laundry    Stability/Clinical Decision Making Evolving/Moderate complexity    Clinical Decision Making Moderate    Rehab Potential Good    PT Frequency 2x / week    PT Duration 8 weeks    PT Treatment/Interventions Cryotherapy;Electrical Stimulation;Moist Heat;Therapeutic activities;Therapeutic exercise;Neuromuscular re-education;Manual techniques;Passive range of motion;ADLs/Self Care Home Management;Functional mobility training;Patient/family education    PT Next Visit Plan Assess grip strength and tricep and bicep flexibility.  PT will reassess special tests to R shoulder.    PT Home Exercise Plan [id number] - R shoulder ER isometrics    Consulted and Agree with Plan of Care Patient           Patient will benefit from skilled therapeutic intervention in order to improve the following deficits and impairments:  Decreased range of motion,Decreased endurance,Impaired UE functional use,Decreased mobility,Decreased strength,Pain,Decreased activity tolerance  Visit Diagnosis: Impaired strength of shoulder muscles  Shoulder joint stiffness, right     Problem List Patient Active Problem List   Diagnosis Date Noted  . Acute cystitis with hematuria 09/25/2017  . Breast pain, left 07/02/2017  . Allergic rhinitis due to pollen 05/22/2012  . Senile atrophic vaginitis 06/20/2011  . Depression 01/27/2008  . Essential hypertension 01/27/2008   Pura Spice, PT, DPT # [id number] 10/03/2020, 6:28 PM  [facility] [address] [place], [place], [address] Phone: [phone]   Fax:  [phone]  Name: Sally Shea MRN: [id number] Date of Birth: 03-04-1946

## 2020-10-03 NOTE — Patient Instructions (Signed)
Access Code: [email or web address]: [email or web address]: 01/31/2022Prepared by: Legrand Como SherkExercises  Standing Shoulder Horizontal Abduction with Resistance - 1 x daily - 4 x weekly - 2 sets - 10 reps  Shoulder External Rotation and Scapular Retraction with Resistance - 1 x daily - 4 x weekly - 2 sets - 10 reps

## 2020-10-06 ENCOUNTER — Ambulatory Visit: Payer: Medicare PPO | Attending: Adult Health | Admitting: Physical Therapy

## 2020-10-06 ENCOUNTER — Other Ambulatory Visit: Payer: Self-pay

## 2020-10-06 DIAGNOSIS — M25511 Pain in right shoulder: Secondary | ICD-10-CM | POA: Insufficient documentation

## 2020-10-06 DIAGNOSIS — R29898 Other symptoms and signs involving the musculoskeletal system: Secondary | ICD-10-CM | POA: Diagnosis not present

## 2020-10-06 DIAGNOSIS — M25611 Stiffness of right shoulder, not elsewhere classified: Secondary | ICD-10-CM | POA: Diagnosis not present

## 2020-10-06 NOTE — Therapy (Signed)
[facility] [address] [place], [place], [address] Phone: [phone]   Fax:  [phone]  Physical Therapy Treatment  Patient Details  Name: Sally Shea MRN: [id number] Date of Birth: Dec 21, 1945 Referring Provider (PT): Murtis Sink   Encounter Date: 10/06/2020   PT End of Session - 10/06/20 0930    Visit Number 3    Number of Visits 16    Date for PT Re-Evaluation 11/24/20    Authorization Type evaluation: 1/27    Authorization - Visit Number 3    Authorization - Number of Visits 10    PT Start Time 0815    PT Stop Time 0915    PT Time Calculation (min) 60 min    Activity Tolerance Patient tolerated treatment well    Behavior During Therapy [facility] for tasks assessed/performed           Past Medical History:  Diagnosis Date  . Allergy    seasonal  . Anemia   . Anxiety   . Blood transfusion without reported diagnosis    years ago  . Degenerative cervical disc   . Depression   . GERD (gastroesophageal reflux disease)   . Headache(784.0)   . Hypertension     Past Surgical History:  Procedure Laterality Date  . COLONOSCOPY    . EYE SURGERY    . TUBAL LIGATION      There were no vitals filed for this visit.   Subjective Assessment - 10/06/20 0853    Subjective Pt states she is having minimal R shoulder soreness this morning and reports it is from the exercises.  Pt. reports her hip is a bigger issue than shoulder.    Pertinent History Injection in the R shoulder last year. Hx of falls onto the R shoulder over the last 8 years. X-rays done on the R shoulder and no significant findings    Limitations Lifting    Patient Stated Goals Pt would like to get back to weight lifting, tennis and pickle ball, and have decreased pain with reaching    Currently in Pain? No/denies          There ex:   Punch ups 2 x15 with 2# weight (increased this visit)  SL ER 2x10 (increased this visit) Prone scap retraction :  elbow bent and elbow straight 2x10 each no weight (added today)  Standing shoulder abd and flexion with 2# DB   Nautilus 20# 2x10 each:  Lat pull down  Rows   Next visit: assess tolerance to HEP and assess R shoulder ROM   HEP:  Piriformis stretch  Seated march SL hip abd    Strength and ROM assessment of hips done today:   ROM in all directions WNL  B ITB and HS flexibility WNL  Decreased R piriformis mm flexibility   MMT:   B hip ext 4-/5 B hip flexion 4-/5 B knee ext  5/5 B knee flex 5/5 R hip IR 4-/5 painful* L Hip IR 4+/5 B hip ER 4/5  L hip abd 4/5 R hip abd 4-/5 painful*       PT Long Term Goals - 09/29/20 1552      PT LONG TERM GOAL #1   Title Pt will increase B shoulder strength to at least 4+/5 with no pain    Baseline 1/27: shoulder flexion, mid trap, and shoulder flexion all below 4+/5 and painful currently    Time 8    Period Weeks  Status New    Target Date 11/24/20      PT LONG TERM GOAL #2   Title Pt will increased FOTO score to 68 to improve functional mobility .    Baseline 1/27: 54    Time 8    Period Weeks    Status New    Target Date 11/24/20      PT LONG TERM GOAL #3   Title Pt will increase R shoulder ER and IR to 90 deg ER and 70 deg IR in AROM to improve ADL.    Baseline 1/27: 75 ER and 65 IR AROM    Time 8    Period Weeks    Status New    Target Date 11/24/20      PT LONG TERM GOAL #4   Title Pt. will carry >10# object with no increase c/o R shoulder pain or limitations to improve household tasks.    Baseline Pt. reports difficulty and pain in R shoulder with carrying/ lifting objects.    Time 8    Period Weeks    Status New    Target Date 11/24/20               Plan - 10/06/20 3299    Clinical Impression Statement Progressed shoulder and scapular strengthening exercises today to increase R shoulder stability and ease functional reaching tasks. Assess R hip ROM, strength, and flexibility and pt presented with  deficits in strength and piriformis flexibility (see objective measurements), which is resulting in decreased R hip stability and increased pain with STS tasks from a chair or from the car.    Examination-Activity Limitations Caring for [employer]    Examination-Participation Restrictions Cleaning;Community Activity;Driving;Laundry    Stability/Clinical Decision Making Evolving/Moderate complexity    Clinical Decision Making Moderate    Rehab Potential Good    PT Frequency 2x / week    PT Duration 8 weeks    PT Treatment/Interventions Cryotherapy;Electrical Stimulation;Moist Heat;Therapeutic activities;Therapeutic exercise;Neuromuscular re-education;Manual techniques;Passive range of motion;ADLs/Self Care Home Management;Functional mobility training;Patient/family education    PT Next Visit Plan assess tolerance to HEP and assess R shoulder ROM .    PT Home Exercise Plan [email or web address] - R shoulder strengthening and piriformis stretch, seated march, and SL hip abd    Consulted and Agree with Plan of Care Patient           Patient will benefit from skilled therapeutic intervention in order to improve the following deficits and impairments:  Decreased range of motion,Decreased endurance,Impaired UE functional use,Decreased mobility,Decreased strength,Pain,Decreased activity tolerance  Visit Diagnosis: Impaired strength of shoulder muscles  Shoulder joint stiffness, right     Problem List Patient Active Problem List   Diagnosis Date Noted  . Acute cystitis with hematuria 09/25/2017  . Breast pain, left 07/02/2017  . Allergic rhinitis due to pollen 05/22/2012  . Senile atrophic vaginitis 06/20/2011  . Depression 01/27/2008  . Essential hypertension 01/27/2008   Pura Spice, PT, DPT # [id number] Kayleen Memos, SPT 10/07/2020, 10:42 AM  [facility] [address] [place], [place], [address] Phone: [phone]    Fax:  [phone]  Name: WILLINE SCHWALBE MRN: [id number] Date of Birth: 19-Dec-1945

## 2020-10-06 NOTE — Patient Instructions (Signed)
Access Code: [email or web address]: [email or web address]: 02/03/2022Prepared by: Legrand Como SherkExercises  Sidelying Hip Abduction - 4 x weekly - 3 sets - 5 reps  Seated March - 4 x weekly - 3 sets - 10 reps  Seated Piriformis Stretch with Trunk [place] - 1 x daily - 7 x weekly - 3 sets - 30 sec hold

## 2020-10-07 ENCOUNTER — Encounter: Payer: Self-pay | Admitting: Physical Therapy

## 2020-10-11 ENCOUNTER — Other Ambulatory Visit: Payer: Self-pay

## 2020-10-11 ENCOUNTER — Encounter: Payer: Self-pay | Admitting: Physical Therapy

## 2020-10-11 ENCOUNTER — Ambulatory Visit: Payer: Medicare PPO | Admitting: Physical Therapy

## 2020-10-11 DIAGNOSIS — R29898 Other symptoms and signs involving the musculoskeletal system: Secondary | ICD-10-CM

## 2020-10-11 DIAGNOSIS — M25611 Stiffness of right shoulder, not elsewhere classified: Secondary | ICD-10-CM | POA: Diagnosis not present

## 2020-10-11 DIAGNOSIS — M25511 Pain in right shoulder: Secondary | ICD-10-CM | POA: Diagnosis not present

## 2020-10-11 NOTE — Therapy (Signed)
[place] [facility] [address]. [place], [place], [address] Phone: [phone]   Fax:  [phone]  Physical Therapy Treatment  Patient Details  Name: Sally Shea MRN: [id number] Date of Birth: 1946-07-20 Referring Provider (PT): Murtis Sink   Encounter Date: 10/11/2020   PT End of Session - 10/11/20 0901    Visit Number 4    Number of Visits 16    Date for PT Re-Evaluation 11/24/20    Authorization Type evaluation: 1/27    Authorization - Visit Number 4    Authorization - Number of Visits 10    PT Start Time 0812    PT Stop Time 0900    PT Time Calculation (min) 48 min    Activity Tolerance Patient tolerated treatment well    Behavior During Therapy [facility] for tasks assessed/performed           Past Medical History:  Diagnosis Date  . Allergy    seasonal  . Anemia   . Anxiety   . Blood transfusion without reported diagnosis    years ago  . Degenerative cervical disc   . Depression   . GERD (gastroesophageal reflux disease)   . Headache(784.0)   . Hypertension     Past Surgical History:  Procedure Laterality Date  . COLONOSCOPY    . EYE SURGERY    . TUBAL LIGATION      There were no vitals filed for this visit. 0812-   Subjective Assessment - 10/11/20 0836    Subjective Pt states her shoulders were a little sore after last appointment, but have since felt great. She also states her R hip is still a little tender, about a 2/10. Getting in and out of the car increases the pain in her R hip.    Pertinent History Injection in the R shoulder last year. Hx of falls onto the R shoulder over the last 8 years. X-rays done on the R shoulder and no significant findings    Limitations Lifting    Patient Stated Goals Pt would like to get back to weight lifting, tennis and pickle ball, and have decreased pain with reaching    Currently in Pain? Yes    Pain Score 2     Pain Location Hip    Pain Orientation Left;Right              There ex:  Nustep level 4 x10 min (added today, took subjective and followed up with HEP during bike) Punch ups 2 x15 with 3# weight (increased this visit)  SL ER 2x10 (increased this visit) Prone scap retraction : elbow bent and elbow straight 2x10 each no weight (added today)  Nautilus 30# 2x10 each:  Lat pull down  Rows  IR x10# x20  Rear delt pull x20# 2x10  TRX rows 2x10 and side lunge 2x8  Next visit: assess  B shoulder and scapular MMT    HEP:  Piriformis stretch  Seated march SL hip abd       [facility] PT Assessment - 10/11/20 0001      AROM   Right Shoulder Flexion 180 Degrees    Right Shoulder ABduction 180 Degrees    Right Shoulder Internal Rotation 55 Degrees    Right Shoulder External Rotation 88 Degrees    Left Shoulder Flexion 180 Degrees    Left Shoulder ABduction 180 Degrees    Left Shoulder Internal Rotation 60 Degrees    Left Shoulder External Rotation 90 Degrees  PT Long Term Goals - 09/29/20 1552      PT LONG TERM GOAL #1   Title Pt will increase B shoulder strength to at least 4+/5 with no pain    Baseline 1/27: shoulder flexion, mid trap, and shoulder flexion all below 4+/5 and painful currently    Time 8    Period Weeks    Status New    Target Date 11/24/20      PT LONG TERM GOAL #2   Title Pt will increased FOTO score to 68 to improve functional mobility .    Baseline 1/27: 54    Time 8    Period Weeks    Status New    Target Date 11/24/20      PT LONG TERM GOAL #3   Title Pt will increase R shoulder ER and IR to 90 deg ER and 70 deg IR in AROM to improve ADL.    Baseline 1/27: 75 ER and 65 IR AROM    Time 8    Period Weeks    Status New    Target Date 11/24/20      PT LONG TERM GOAL #4   Title Pt. will carry >10# object with no increase c/o R shoulder pain or limitations to improve household tasks.    Baseline Pt. reports difficulty and pain in R shoulder with carrying/ lifting objects.    Time  8    Period Weeks    Status New    Target Date 11/24/20                 Plan - 10/11/20 0856    Clinical Impression Statement ROM assessment done on B shoulders today and pt presented with improvements in R shoulder AROM as compared to at [facility], which results in improved R shoulder mechanics and decreased pain with functional reaching. Progressed shoulder and hip strength exercises today to address deficits and increase stability of the R shoulder and R hip and pt tolerated well with no c/o increased pain and good form.    Examination-Activity Limitations Caring for [employer]    Examination-Participation Restrictions Cleaning;Community Activity;Driving;Laundry    Stability/Clinical Decision Making Evolving/Moderate complexity    Clinical Decision Making Moderate    Rehab Potential Good    PT Frequency 2x / week    PT Duration 8 weeks    PT Treatment/Interventions Cryotherapy;Electrical Stimulation;Moist Heat;Therapeutic activities;Therapeutic exercise;Neuromuscular re-education;Manual techniques;Passive range of motion;ADLs/Self Care Home Management;Functional mobility training;Patient/family education    PT Next Visit Plan Progress HEP    PT Home Exercise Plan [email or web address] - R shoulder strengthening and piriformis stretch, seated march, and SL hip abd    Consulted and Agree with Plan of Care Patient           Patient will benefit from skilled therapeutic intervention in order to improve the following deficits and impairments:  Decreased range of motion,Decreased endurance,Impaired UE functional use,Decreased mobility,Decreased strength,Pain,Decreased activity tolerance  Visit Diagnosis: Impaired strength of shoulder muscles  Shoulder joint stiffness, right  Acute pain of right shoulder     Problem List Patient Active Problem List   Diagnosis Date Noted  . Acute cystitis with hematuria 09/25/2017  . Breast pain, left 07/02/2017  . Allergic  rhinitis due to pollen 05/22/2012  . Senile atrophic vaginitis 06/20/2011  . Depression 01/27/2008  . Essential hypertension 01/27/2008    Pura Spice, PT, DPT # [place] SPT 10/11/2020, 9:26 AM  [place]  [address] [place], [place], [address] Phone: [phone]   Fax:  [phone]  Name: Sally Shea MRN: [id number] Date of Birth: 1945-11-04

## 2020-10-13 ENCOUNTER — Other Ambulatory Visit: Payer: Self-pay

## 2020-10-13 ENCOUNTER — Encounter: Payer: Self-pay | Admitting: Physical Therapy

## 2020-10-13 ENCOUNTER — Ambulatory Visit: Payer: Medicare PPO | Admitting: Physical Therapy

## 2020-10-13 DIAGNOSIS — M25611 Stiffness of right shoulder, not elsewhere classified: Secondary | ICD-10-CM

## 2020-10-13 DIAGNOSIS — R29898 Other symptoms and signs involving the musculoskeletal system: Secondary | ICD-10-CM | POA: Diagnosis not present

## 2020-10-13 DIAGNOSIS — M25511 Pain in right shoulder: Secondary | ICD-10-CM

## 2020-10-13 NOTE — Therapy (Signed)
[place] [facility] [address] [place], [place], [address] Phone: [phone]   Fax:  [phone]  Physical Therapy Treatment  Patient Details  Name: Sally Shea MRN: [id number] Date of Birth: 24-Mar-1946 Referring Provider (PT): Murtis Sink   Encounter Date: 10/13/2020   PT End of Session - 10/13/20 1045    Visit Number 5    Number of Visits 16    Date for PT Re-Evaluation 11/24/20    Authorization Type evaluation: 1/27    Authorization - Visit Number 5    Authorization - Number of Visits 10    PT Start Time 0922    PT Stop Time 1010    PT Time Calculation (min) 48 min    Activity Tolerance Patient tolerated treatment well    Behavior During Therapy [facility] for tasks assessed/performed           Past Medical History:  Diagnosis Date  . Allergy    seasonal  . Anemia   . Anxiety   . Blood transfusion without reported diagnosis    years ago  . Degenerative cervical disc   . Depression   . GERD (gastroesophageal reflux disease)   . Headache(784.0)   . Hypertension     Past Surgical History:  Procedure Laterality Date  . COLONOSCOPY    . EYE SURGERY    . TUBAL LIGATION      There were no vitals filed for this visit.   Subjective Assessment - 10/13/20 1041    Subjective Pt reports muscle soreness from working out in both of her shoulders and hips today, but no pain currently. Pt states her pain has decreased in her R shoulder since starting therapy and she feels like she's getting stronger.    Pertinent History Injection in the R shoulder last year. Hx of falls onto the R shoulder over the last 8 years. X-rays done on the R shoulder and no significant findings    Limitations Lifting    Patient Stated Goals Pt would like to get back to weight lifting, tennis and pickle ball, and have decreased pain with reaching    Currently in Pain? No/denies             There ex:  Nustep level 4 x10 min (nonbilled) Punch  ups 2 x15 with 3# weight SL ER 2x10 2# (increased this visit)   Nautilus 30# 2x10 each:  Lat pull down  IR x10# x20  Rear delt pull x20# 2x10   Performed added HEP exercises today (see below) x10 each   Assessed B shoulder MMT Assess scapular MMT next visit.   HEP:  Piriformis stretch  Seated march SL hip abd  Added today:  See handout Punch ups  SL ER  Modified planks 1x15 sec Shoulder flexion and scaption  Wall push up  Horizontal abd     [facility] PT Assessment - 10/13/20 0001      Strength   Right Shoulder Flexion 4+/5    Right Shoulder Extension 4+/5    Right Shoulder ABduction 4+/5    Right Shoulder Internal Rotation 4+/5    Right Shoulder External Rotation 4/5    Left Shoulder Flexion 4+/5    Left Shoulder Extension 4+/5    Left Shoulder ABduction 4+/5    Left Shoulder Internal Rotation 4+/5    Left Shoulder External Rotation 4/5    Right Elbow Flexion 4+/5    Right Elbow Extension 4+/5    Left Elbow Flexion 4+/5  Left Elbow Extension 4+/5             PT Long Term Goals - 09/29/20 1552      PT LONG TERM GOAL #1   Title Pt will increase B shoulder strength to at least 4+/5 with no pain    Baseline 1/27: shoulder flexion, mid trap, and shoulder flexion all below 4+/5 and painful currently    Time 8    Period Weeks    Status New    Target Date 11/24/20      PT LONG TERM GOAL #2   Title Pt will increased FOTO score to 68 to improve functional mobility .    Baseline 1/27: 54    Time 8    Period Weeks    Status New    Target Date 11/24/20      PT LONG TERM GOAL #3   Title Pt will increase R shoulder ER and IR to 90 deg ER and 70 deg IR in AROM to improve ADL.    Baseline 1/27: 75 ER and 65 IR AROM    Time 8    Period Weeks    Status New    Target Date 11/24/20      PT LONG TERM GOAL #4   Title Pt. will carry >10# object with no increase c/o R shoulder pain or limitations to improve household tasks.    Baseline Pt. reports difficulty  and pain in R shoulder with carrying/ lifting objects.    Time 8    Period Weeks    Status New    Target Date 11/24/20                 Plan - 10/13/20 1049    Clinical Impression Statement Pt demos improvements in B shoulder strength today as compared to at IE evidence by updated measurements. Improved strength results in improved R shoulder stability and decreased pain with functional reaching tasks. Updated HEP today to further increase B shoulder strength, stability, and endurance to improve functional reaching and lifting.    Examination-Activity Limitations Caring for [employer]    Examination-Participation Restrictions Cleaning;Community Activity;Driving;Laundry    Stability/Clinical Decision Making Evolving/Moderate complexity    Clinical Decision Making Moderate    Rehab Potential Good    PT Frequency 2x / week    PT Duration 8 weeks    PT Treatment/Interventions Cryotherapy;Electrical Stimulation;Moist Heat;Therapeutic activities;Therapeutic exercise;Neuromuscular re-education;Manual techniques;Passive range of motion;ADLs/Self Care Home Management;Functional mobility training;Patient/family education    PT Next Visit Plan assess tolerance to updated HEP    PT Home Exercise Plan [email or web address] - R shoulder strengthening and piriformis stretch, seated march, and SL hip abd    Consulted and Agree with Plan of Care Patient           Patient will benefit from skilled therapeutic intervention in order to improve the following deficits and impairments:  Decreased range of motion,Decreased endurance,Impaired UE functional use,Decreased mobility,Decreased strength,Pain,Decreased activity tolerance  Visit Diagnosis: Impaired strength of shoulder muscles  Shoulder joint stiffness, right  Acute pain of right shoulder     Problem List Patient Active Problem List   Diagnosis Date Noted  . Acute cystitis with hematuria 09/25/2017  . Breast pain,  left 07/02/2017  . Allergic rhinitis due to pollen 05/22/2012  . Senile atrophic vaginitis 06/20/2011  . Depression 01/27/2008  . Essential hypertension 01/27/2008    Pura Spice, PT, DPT # [place] SPT 10/13/2020, 11:06 AM  [place]  [facility] [address]. [place], [place], [address] Phone: [phone]   Fax:  [phone]  Name: FLO BERROA MRN: [id number] Date of Birth: Mar 18, 1946

## 2020-10-13 NOTE — Patient Instructions (Signed)
Access Code: [email or web address]: [email or web address]: 02/10/2022Prepared by: Legrand Como SherkExercises  Supine Scapular Protraction in Flexion with Dumbbells - 1 x daily - 7 x weekly - 3 sets - 10 reps  Wall Push Up - 1 x daily - 7 x weekly - 3 sets - 10 reps  Scaption with Dumbbells - 1 x daily - 7 x weekly - 3 sets - 10 reps  Plank on Knees - 1 x daily - 7 x weekly - 3 sets - 10 reps  Shoulder PNF D2 with Resistance - 1 x daily - 7 x weekly - 3 sets - 10 reps  Standing Shoulder Flexion with Resistance - 1 x daily - 7 x weekly - 3 sets - 10 reps  Seated Shoulder Horizontal Abduction with Resistance - Palms Down - 1 x daily - 7 x weekly - 3 sets - 10 reps  Sidelying Shoulder External Rotation - 1 x daily - 7 x weekly - 3 sets - 10 reps

## 2020-10-17 ENCOUNTER — Encounter: Payer: Self-pay | Admitting: Physical Therapy

## 2020-10-17 ENCOUNTER — Ambulatory Visit: Payer: Medicare PPO | Admitting: Physical Therapy

## 2020-10-17 ENCOUNTER — Other Ambulatory Visit: Payer: Self-pay

## 2020-10-17 DIAGNOSIS — M25511 Pain in right shoulder: Secondary | ICD-10-CM | POA: Diagnosis not present

## 2020-10-17 DIAGNOSIS — R29898 Other symptoms and signs involving the musculoskeletal system: Secondary | ICD-10-CM | POA: Diagnosis not present

## 2020-10-17 DIAGNOSIS — M25611 Stiffness of right shoulder, not elsewhere classified: Secondary | ICD-10-CM | POA: Diagnosis not present

## 2020-10-17 NOTE — Patient Instructions (Signed)
Access Code: [email or web address]: [email or web address]: 02/14/2022Prepared by: Legrand Como SherkExercises  Supine Figure 4 Piriformis Stretch - 1 x daily - 7 x weekly - 1 sets - 5 reps  Bridge - 1 x daily - 7 x weekly - 2 sets - 10 reps  Clam with Resistance - 1 x daily - 7 x weekly - 2 sets - 10 reps

## 2020-10-17 NOTE — Therapy (Signed)
[facility] [address]. [place], [place], [address] Phone: [phone]   Fax:  [phone]  Physical Therapy Treatment  Patient Details  Name: Sally Shea MRN: [id number] Date of Birth: 12/26/1945 Referring Provider (PT): Murtis Sink   Encounter Date: 10/17/2020   PT End of Session - 10/17/20 1212    Visit Number 6    Number of Visits 16    Date for PT Re-Evaluation 11/24/20    Authorization Type evaluation: 1/27    Authorization - Visit Number 6    Authorization - Number of Visits 10    PT Start Time 0935    PT Stop Time 7001    PT Time Calculation (min) 48 min    Activity Tolerance Patient tolerated treatment well    Behavior During Therapy [facility] for tasks assessed/performed           Past Medical History:  Diagnosis Date  . Allergy    seasonal  . Anemia   . Anxiety   . Blood transfusion without reported diagnosis    years ago  . Degenerative cervical disc   . Depression   . GERD (gastroesophageal reflux disease)   . Headache(784.0)   . Hypertension     Past Surgical History:  Procedure Laterality Date  . COLONOSCOPY    . EYE SURGERY    . TUBAL LIGATION      There were no vitals filed for this visit.   Subjective Assessment - 10/17/20 1215    Subjective Pt states her R shoulder is slightly sore from the exercises and she would not rate it as anything on the NPS. Pt states she has some pain in her right hip when she gets up out of a chair or when she gets out of the car.  Pt states she was able to do her HEP over the weekend and has no questions about them.    Pertinent History Injection in the R shoulder last year. Hx of falls onto the R shoulder over the last 8 years. X-rays done on the R shoulder and no significant findings    Limitations Lifting    Patient Stated Goals Pt would like to get back to weight lifting, tennis and pickle ball, and have decreased pain with reaching    Currently in Pain?  No/denies            There ex:  Nustep level 4 x10 min (nonbilled) Bridges 2x10 (added today)  Clams with RTB x20 (added today)  Supine figure 4 piriformis stretch 3x30 sec (added today)  Punch ups 2 x15 with3# weight SL ER 2x10 2# (increased this visit)   Nautilus30# 2x10 each:  Lat pull down  IR x10# x20   Rear delt pull x20# 2x10   TRX:  Rows and squats 2x10    Assessed B shoulder MMT Assess scapular MMT next visit.   HEP:  Piriformis stretch  Seated march SL hip abd Punch ups  SL ER  Modified planks 1x15 sec Shoulder flexion and scaption  Wall push up  Horizontal abd Added today:  Bridges  Clams  Supine figure 4 stretch   MMT assessment:  5/5 B HS MMT   R hip abd 4-/5 L hip abd 4/5    PT Education - 10/17/20 1214    Education Details updated HEP    Person(s) Educated Patient    Methods Explanation;Handout;Demonstration    Comprehension Verbalized understanding;Returned demonstration  PT Long Term Goals - 09/29/20 1552      PT LONG TERM GOAL #1   Title Pt will increase B shoulder strength to at least 4+/5 with no pain    Baseline 1/27: shoulder flexion, mid trap, and shoulder flexion all below 4+/5 and painful currently    Time 8    Period Weeks    Status New    Target Date 11/24/20      PT LONG TERM GOAL #2   Title Pt will increased FOTO score to 68 to improve functional mobility .    Baseline 1/27: 54    Time 8    Period Weeks    Status New    Target Date 11/24/20      PT LONG TERM GOAL #3   Title Pt will increase R shoulder ER and IR to 90 deg ER and 70 deg IR in AROM to improve ADL.    Baseline 1/27: 75 ER and 65 IR AROM    Time 8    Period Weeks    Status New    Target Date 11/24/20      PT LONG TERM GOAL #4   Title Pt. will carry >10# object with no increase c/o R shoulder pain or limitations to improve household tasks.    Baseline Pt. reports difficulty and pain in R shoulder with carrying/  lifting objects.    Time 8    Period Weeks    Status New    Target Date 11/24/20                 Plan - 10/17/20 1211    Clinical Impression Statement Pt presented with Increased TTP of the R piriformis compared to the L  Today evident during manual palpation and resulting in increased occurrence of pain in the R glute and hip area with transfers from a chair or a car. Added exercises today to address glute mm strength and flexibility and pt tolerated well with no c/o increased pain. HS strength assessed today with no deficits noticed    Examination-Activity Limitations Caring for Others;Carry;Dressing;Lift;Reach Overhead    Examination-Participation Restrictions Cleaning;Community Activity;Driving;Laundry    Stability/Clinical Decision Making Evolving/Moderate complexity    Clinical Decision Making Moderate    Rehab Potential Good    PT Frequency 2x / week    PT Duration 8 weeks    PT Treatment/Interventions Cryotherapy;Electrical Stimulation;Moist Heat;Therapeutic activities;Therapeutic exercise;Neuromuscular re-education;Manual techniques;Passive range of motion;ADLs/Self Care Home Management;Functional mobility training;Patient/family education    PT Next Visit Plan assess tolerance to updated HEP    PT Home Exercise Plan [email or web address] - R shoulder strengthening and piriformis stretch, seated march, and SL hip abd    Consulted and Agree with Plan of Care Patient           Patient will benefit from skilled therapeutic intervention in order to improve the following deficits and impairments:  Decreased range of motion,Decreased endurance,Impaired UE functional use,Decreased mobility,Decreased strength,Pain,Decreased activity tolerance  Visit Diagnosis: Impaired strength of shoulder muscles  Shoulder joint stiffness, right     Problem List Patient Active Problem List   Diagnosis Date Noted  . Acute cystitis with hematuria 09/25/2017  . Breast pain, left 07/02/2017  .  Allergic rhinitis due to pollen 05/22/2012  . Senile atrophic vaginitis 06/20/2011  . Depression 01/27/2008  . Essential hypertension 01/27/2008    Pura Spice, PT, DPT # [place] SPT 10/17/2020, 6:31 PM  [facility] [address] [place], [place], [address] Phone: [phone]   Fax:  [phone]  Name: Sally Shea MRN: [id number] Date of Birth: [birth year]

## 2020-10-20 ENCOUNTER — Encounter: Payer: Medicare PPO | Admitting: Physical Therapy

## 2020-10-24 ENCOUNTER — Encounter: Payer: Self-pay | Admitting: Physical Therapy

## 2020-10-24 ENCOUNTER — Ambulatory Visit: Payer: Medicare PPO | Admitting: Physical Therapy

## 2020-10-24 ENCOUNTER — Other Ambulatory Visit: Payer: Self-pay

## 2020-10-24 DIAGNOSIS — M25511 Pain in right shoulder: Secondary | ICD-10-CM

## 2020-10-24 DIAGNOSIS — R29898 Other symptoms and signs involving the musculoskeletal system: Secondary | ICD-10-CM | POA: Diagnosis not present

## 2020-10-24 DIAGNOSIS — M25611 Stiffness of right shoulder, not elsewhere classified: Secondary | ICD-10-CM | POA: Diagnosis not present

## 2020-10-24 NOTE — Therapy (Signed)
[facility] [address] [place], [place], [address] Phone: [phone]   Fax:  [phone]  Physical Therapy Treatment  Patient Details  Name: Sally Shea MRN: [id number] Date of Birth: 17-May-1946 Referring Provider (PT): Murtis Sink   Encounter Date: 10/24/2020   PT End of Session - 10/24/20 1037    Visit Number 7    Number of Visits 16    Date for PT Re-Evaluation 11/24/20    Authorization Type evaluation: 1/27    Authorization - Visit Number 7    Authorization - Number of Visits 10    PT Start Time 0945    PT Stop Time 1030    PT Time Calculation (min) 45 min    Activity Tolerance Patient tolerated treatment well    Behavior During Therapy [facility] for tasks assessed/performed           Past Medical History:  Diagnosis Date  . Allergy    seasonal  . Anemia   . Anxiety   . Blood transfusion without reported diagnosis    years ago  . Degenerative cervical disc   . Depression   . GERD (gastroesophageal reflux disease)   . Headache(784.0)   . Hypertension     Past Surgical History:  Procedure Laterality Date  . COLONOSCOPY    . EYE SURGERY    . TUBAL LIGATION      There were no vitals filed for this visit.   Subjective Assessment - 10/24/20 1014    Subjective Pt states she is feeling good today and having no pain. However, pt had some soreness over the weekend after the last appointment and also had some increased difficulty and pain with going up and down stairs at her son's house. Pt states she would lean against the wall to decrease the pain in the steps. Pt reports being compliant with her HEP and she feels it is helping with her strength and pain.    Pertinent History Injection in the R shoulder last year. Hx of falls onto the R shoulder over the last 8 years. X-rays done on the R shoulder and no significant findings    Limitations Lifting    Patient Stated Goals Pt would like to get back to weight  lifting, tennis and pickle ball, and have decreased pain with reaching    Currently in Pain? No/denies             Visit 7/10 There ex:    Nustep level 5 x10 min (subjective taken during) Bridges with ball squeeze 2x10  Bridge with march x10 (added today) SL hip abd (added today)  Supine figure 4 piriformis stretch 3x30 sec   SL ER 2x10 2#       Nautilus 30# 2x10 each:  Lat pull down   IR x10# x20   Rear delt pull x20# 2x10 Rows 20#    TRX:  squats 2x10      Assessed B scapular MMT:  Mid trap, low trap, serratus, shoulder extension, rear delt all 4+/5 bilaterally.   Triceps 5/5    HEP:  Piriformis stretch  Seated march SL hip abd  Punch ups  SL ER  Modified planks 1x15 sec Shoulder flexion and scaption  Wall push up  Horizontal abd Bridges  Clams  Supine figure 4 stretch      PT Long Term Goals - 09/29/20 1552      PT LONG TERM GOAL #1   Title Pt will increase B  shoulder strength to at least 4+/5 with no pain    Baseline 1/27: shoulder flexion, mid trap, and shoulder flexion all below 4+/5 and painful currently    Time 8    Period Weeks    Status New    Target Date 11/24/20      PT LONG TERM GOAL #2   Title Pt will increased FOTO score to 68 to improve functional mobility .    Baseline 1/27: 54    Time 8    Period Weeks    Status New    Target Date 11/24/20      PT LONG TERM GOAL #3   Title Pt will increase R shoulder ER and IR to 90 deg ER and 70 deg IR in AROM to improve ADL.    Baseline 1/27: 75 ER and 65 IR AROM    Time 8    Period Weeks    Status New    Target Date 11/24/20      PT LONG TERM GOAL #4   Title Pt. will carry >10# object with no increase c/o R shoulder pain or limitations to improve household tasks.    Baseline Pt. reports difficulty and pain in R shoulder with carrying/ lifting objects.    Time 8    Period Weeks    Status New    Target Date 11/24/20                 Plan - 10/24/20 1211    Clinical  Impression Statement Pt presents with improved scapular strength today as compared to at IE, which improves B shoulder stability and function. Pt continues to demo decreased B hip abd strength, which decreases B hip stability and increased difficulty with stairs.  No changes to HEP and pt. will continue with current HEP.    Examination-Activity Limitations Caring for [employer]    Examination-Participation Restrictions Cleaning;Community Activity;Driving;Laundry    Stability/Clinical Decision Making Evolving/Moderate complexity    Clinical Decision Making Moderate    Rehab Potential Good    PT Frequency 2x / week    PT Duration 8 weeks    PT Treatment/Interventions Cryotherapy;Electrical Stimulation;Moist Heat;Therapeutic activities;Therapeutic exercise;Neuromuscular re-education;Manual techniques;Passive range of motion;ADLs/Self Care Home Management;Functional mobility training;Patient/family education    PT Next Visit Plan continue to improve B hip abd strength and endurance to increase tolerance to stair negotiation.    PT Home Exercise Plan [email or web address] - R shoulder strengthening and piriformis stretch, seated march, and SL hip abd    Consulted and Agree with Plan of Care Patient           Patient will benefit from skilled therapeutic intervention in order to improve the following deficits and impairments:  Decreased range of motion,Decreased endurance,Impaired UE functional use,Decreased mobility,Decreased strength,Pain,Decreased activity tolerance  Visit Diagnosis: Impaired strength of shoulder muscles  Shoulder joint stiffness, right  Acute pain of right shoulder     Problem List Patient Active Problem List   Diagnosis Date Noted  . Acute cystitis with hematuria 09/25/2017  . Breast pain, left 07/02/2017  . Allergic rhinitis due to pollen 05/22/2012  . Senile atrophic vaginitis 06/20/2011  . Depression 01/27/2008  . Essential hypertension  01/27/2008    Pura Spice, PT, DPT # [phone] Kayleen Memos SPT 10/24/2020, 4:48 PM  [place] [facility] [address] [place], [place], [address] Phone: [phone]   Fax:  [phone]  Name: Sally Shea MRN: [id number] Date of Birth: May 23, 1946

## 2020-10-26 ENCOUNTER — Encounter: Payer: Self-pay | Admitting: Physical Therapy

## 2020-10-26 ENCOUNTER — Ambulatory Visit: Payer: Medicare PPO | Admitting: Physical Therapy

## 2020-10-26 ENCOUNTER — Other Ambulatory Visit: Payer: Self-pay

## 2020-10-26 DIAGNOSIS — R29898 Other symptoms and signs involving the musculoskeletal system: Secondary | ICD-10-CM | POA: Diagnosis not present

## 2020-10-26 DIAGNOSIS — M25611 Stiffness of right shoulder, not elsewhere classified: Secondary | ICD-10-CM

## 2020-10-26 DIAGNOSIS — M25511 Pain in right shoulder: Secondary | ICD-10-CM | POA: Diagnosis not present

## 2020-10-26 NOTE — Therapy (Signed)
[facility] [address] [place], [place], [address] Phone: [phone]   Fax:  [phone]  Physical Therapy Treatment  Patient Details  Name: Sally Shea MRN: [id number] Date of Birth: 1946-08-11 Referring Provider (PT): Murtis Sink   Encounter Date: 10/26/2020   PT End of Session - 10/26/20 0728    Visit Number 8    Number of Visits 16    Date for PT Re-Evaluation 11/24/20    Authorization Type evaluation: 1/27    Authorization - Visit Number 8    Authorization - Number of Visits 10    PT Start Time 0725    PT Stop Time 0810    PT Time Calculation (min) 45 min    Activity Tolerance Patient tolerated treatment well    Behavior During Therapy [facility] for tasks assessed/performed           Past Medical History:  Diagnosis Date  . Allergy    seasonal  . Anemia   . Anxiety   . Blood transfusion without reported diagnosis    years ago  . Degenerative cervical disc   . Depression   . GERD (gastroesophageal reflux disease)   . Headache(784.0)   . Hypertension     Past Surgical History:  Procedure Laterality Date  . COLONOSCOPY    . EYE SURGERY    . TUBAL LIGATION      There were no vitals filed for this visit.     Subjective Assessment - 10/26/20 0851    Subjective Pt states she is having no pain today and has been keeping up with her HEP. Pt states that PT has helped her to decrease her overall pain.    Pertinent History Injection in the R shoulder last year. Hx of falls onto the R shoulder over the last 8 years. X-rays done on the R shoulder and no significant findings    Limitations Lifting    Patient Stated Goals Pt would like to get back to weight lifting, tennis and pickle ball, and have decreased pain with reaching    Currently in Pain? No/denies          Visit 8/10 There ex:  Nustep level 5 x10 min (subjective taken during first 5 minutes) Bridge with march x10  SL hip abd 2x10 Supine figure  4 piriformis stretch 3x30 sec  SL ER 2x152#(increased reps today)  sidelying IR stretch for R shoulder 3x30 sec    Nautilus30# 2x10 each:  Lat pull down  IR x10# x20    TRX:  squats 2x10 Rows 2x10  Measurement on 10/26/20 R ER AROM = 76 deg , PROM= 90 deg  R IR AROM = 63 deg, PROM 75 deg   L ER AROM= 91 deg  L IR AROM = 76 deg   Assessed B scapular MMT on 10/24/20:  Mid trap, low trap, serratus, shoulder extension, rear delt all 4+/5 bilaterally.   Triceps 5/5    HEP:  Piriformis stretch  Seated march SL hip abd Punch ups  SL ER  Modified planks 1x15 sec Shoulder flexion and scaption  Wall push up  Horizontal abd Bridges  Clams  Supine figure 4 stretch  Added today:  Shoulder IR and ER stretch        PT Long Term Goals - 10/26/20 0757      PT LONG TERM GOAL #1   Title Pt will increase B shoulder strength to at least 4+/5 with no pain  Baseline 1/27: shoulder flexion, mid trap, and shoulder flexion all below 4+/5 and painful currently, 10/24/20: Mid trap, low trap, serratus, shoulder extension, rear delt all 4+/5 bilaterally.    Triceps 5/5    Time 8    Period Weeks    Status Achieved    Target Date 10/26/20      PT LONG TERM GOAL #2   Title Pt will increased FOTO score to 68 to improve functional mobility .    Baseline 1/27: 54    Time 8    Period Weeks    Status On-going    Target Date 11/24/20      PT LONG TERM GOAL #3   Title Pt will increase R shoulder ER and IR to 90 deg ER and 70 deg IR in AROM to improve ADL.    Baseline 1/27: 75 ER and 65 IR AROM, 10/26/20:R ER AROM = 76 deg , PROM= 90 deg;   R IR AROM = 63 deg, PROM 75 deg,  L ER AROM= 91 deg,   L IR AROM = 76 deg    Time 8    Period Weeks    Status On-going    Target Date 11/24/20      PT LONG TERM GOAL #4   Title Pt. will carry >10# object with no increase c/o R shoulder pain or limitations to improve household tasks.    Baseline IE: Pt. reports difficulty and pain in  R shoulder with carrying/ lifting objects. 10/26/20: able to amb 173ft with 15# box with no increaed pain or difficulty.    Time 8    Period Weeks    Status Achieved    Target Date 10/26/20                 Plan - 10/26/20 0840    Clinical Impression Statement Pt presents with decreased AROM of R shoulder IR and ER compared to the L shoulder due to capsular and mm tightness evident by AROM and PROM measurements taken today. However, pt achieved her goal of carrying 10# without pain, which improves tolerance to carrying groceries and laundry. HEP given to increase R shoulder IR and ER ROM.  Probable discharge next visit if no setbacks.    Examination-Activity Limitations Caring for [employer]    Examination-Participation Restrictions Cleaning;Community Activity;Driving;Laundry    Stability/Clinical Decision Making Evolving/Moderate complexity    Clinical Decision Making Moderate    Rehab Potential Good    PT Frequency 2x / week    PT Duration 8 weeks    PT Treatment/Interventions Cryotherapy;Electrical Stimulation;Moist Heat;Therapeutic activities;Therapeutic exercise;Neuromuscular re-education;Manual techniques;Passive range of motion;ADLs/Self Care Home Management;Functional mobility training;Patient/family education    PT Next Visit Plan continue to improve B hip abd strength and endurance to increase tolerance to stair negotiation.   CHECK GOALS/ PROBABLE DISCHARGE NEXT VISIT    PT Home Exercise Plan [email or web address] - R shoulder strengthening and piriformis stretch, seated march, and SL hip abd    Consulted and Agree with Plan of Care Patient           Patient will benefit from skilled therapeutic intervention in order to improve the following deficits and impairments:  Decreased range of motion,Decreased endurance,Impaired UE functional use,Decreased mobility,Decreased strength,Pain,Decreased activity tolerance  Visit Diagnosis: Impaired strength of  shoulder muscles  Shoulder joint stiffness, right     Problem List Patient Active Problem List   Diagnosis Date Noted  . Acute cystitis with hematuria 09/25/2017  . Breast pain, left 07/02/2017  .  Allergic rhinitis due to pollen 05/22/2012  . Senile atrophic vaginitis 06/20/2011  . Depression 01/27/2008  . Essential hypertension 01/27/2008    Pura Spice, PT, DPT # [phone] Kayleen Memos SPT 10/26/2020, 10:05 AM  [facility] [address] [place], [place], [address] Phone: [phone]   Fax:  [phone]  Name: Sally Shea MRN: [id number] Date of Birth: 03-Sep-1946

## 2020-10-26 NOTE — Patient Instructions (Signed)
Access Code: [email or web address]: [email or web address]: 02/23/2022Prepared by: Legrand Como SherkExercises  Supine Scapular Protraction in Flexion with Dumbbells - 1 x daily - 4 x weekly - 3 sets - 10 reps  Wall Push Up - 1 x daily - 4 x weekly - 3 sets - 10 reps  Scaption with Dumbbells - 1 x daily - 4 x weekly - 3 sets - 10 reps  Shoulder PNF D2 with Resistance - 1 x daily - 4 x weekly - 3 sets - 10 reps  Standing Shoulder Flexion with Resistance - 1 x daily - 4 x weekly - 3 sets - 10 reps  Seated Shoulder Horizontal Abduction with Resistance - Palms Down - 1 x daily - 4 x weekly - 3 sets - 10 reps  Sidelying Shoulder External Rotation - 1 x daily - 4 x weekly - 2 sets - 10 reps  Plank on Knees - 1 x daily - 3 x weekly - 3 sets - 10 reps  Sidelying Hip Abduction - 1 x daily - 4 x weekly - 2 sets - 10 reps  Sleeper Stretch - 1 x daily - 4 x weekly - 1 sets - 10 reps  Marching Bridge - 1 x daily - 4 x weekly - 2 sets - 10 reps  Standing Shoulder External Rotation Stretch in Doorway - 1 x daily - 4 x weekly - 1 sets - 10 reps

## 2020-10-27 ENCOUNTER — Encounter: Payer: Medicare PPO | Admitting: Physical Therapy

## 2020-10-27 DIAGNOSIS — Z85828 Personal history of other malignant neoplasm of skin: Secondary | ICD-10-CM | POA: Diagnosis not present

## 2020-10-27 DIAGNOSIS — L570 Actinic keratosis: Secondary | ICD-10-CM | POA: Diagnosis not present

## 2020-10-27 DIAGNOSIS — L738 Other specified follicular disorders: Secondary | ICD-10-CM | POA: Diagnosis not present

## 2020-10-31 ENCOUNTER — Encounter: Payer: Medicare PPO | Admitting: Physical Therapy

## 2020-11-03 ENCOUNTER — Encounter: Payer: Self-pay | Admitting: Physical Therapy

## 2020-11-03 ENCOUNTER — Other Ambulatory Visit: Payer: Self-pay

## 2020-11-03 ENCOUNTER — Ambulatory Visit: Payer: Medicare PPO | Attending: Adult Health | Admitting: Physical Therapy

## 2020-11-03 DIAGNOSIS — M25611 Stiffness of right shoulder, not elsewhere classified: Secondary | ICD-10-CM | POA: Diagnosis not present

## 2020-11-03 NOTE — Therapy (Signed)
[facility] [address] [place], [place], [address] Phone: [phone]   Fax:  [phone]  Physical Therapy Treatment  Patient Details  Name: Sally Shea MRN: [id number] Date of Birth: Oct 06, 1945 Referring Provider (PT): Murtis Sink   Encounter Date: 11/03/2020   PT End of Session - 11/03/20 0939    Visit Number 9    Number of Visits 16    Date for PT Re-Evaluation 11/24/20    Authorization Type evaluation: 1/27    Authorization - Visit Number 9    Authorization - Number of Visits 10    PT Start Time 0900    PT Stop Time 0933    PT Time Calculation (min) 33 min    Activity Tolerance Patient tolerated treatment well    Behavior During Therapy [facility] for tasks assessed/performed           Past Medical History:  Diagnosis Date  . Allergy    seasonal  . Anemia   . Anxiety   . Blood transfusion without reported diagnosis    years ago  . Degenerative cervical disc   . Depression   . GERD (gastroesophageal reflux disease)   . Headache(784.0)   . Hypertension     Past Surgical History:  Procedure Laterality Date  . COLONOSCOPY    . EYE SURGERY    . TUBAL LIGATION      There were no vitals filed for this visit.    Subjective Assessment - 11/03/20 1005    Subjective Pt states she is having no pain today and her HEP is going well. She thinks her hip and shoulders are much better.    Pertinent History Injection in the R shoulder last year. Hx of falls onto the R shoulder over the last 8 years. X-rays done on the R shoulder and no significant findings    Limitations Lifting    Patient Stated Goals Pt would like to get back to weight lifting, tennis and pickle ball, and have decreased pain with reaching    Currently in Pain? No/denies          There ex:    Nustep level 5 x10 min (subjective taken during first 5 minutes and FOTO taken next 5 minutes) Bridge with march x10  SL hip abd 2x10 SL ER 2x15 2#    sidelying IR stretch for R shoulder 3x30 sec        Nautilus 30# 2x10 each:  Lat pull down   IR x10# x20       TRX:  squats 2x10  Rows 2x10   Measurement on 10/26/20 R ER AROM = 76 deg , PROM= 90 deg  R IR AROM = 63 deg, PROM 75 deg   L ER AROM= 91 deg  L IR AROM = 76 deg     Assessed B scapular MMT on 10/24/20:  Mid trap, low trap, serratus, shoulder extension, rear delt all 4+/5 bilaterally.   Triceps 5/5      HEP:  Piriformis stretch  Seated march SL hip abd  Punch ups  SL ER  Modified planks 1x15 sec Shoulder flexion and scaption  Wall push up  Horizontal abd Bridges  Clams  Supine figure 4 stretch  Added today:  Shoulder IR and ER stretch         PT Long Term Goals - 11/03/20 0903      PT LONG TERM GOAL #1   Title Pt will increase B shoulder strength  to at least 4+/5 with no pain    Baseline 1/27: shoulder flexion, mid trap, and shoulder flexion all below 4+/5 and painful currently, 10/24/20: Mid trap, low trap, serratus, shoulder extension, rear delt all 4+/5 bilaterally.    Triceps 5/5    Time 8    Period Weeks    Status Achieved    Target Date 10/26/20      PT LONG TERM GOAL #2   Title Pt will increased FOTO score to 68 to improve functional mobility .    Baseline 1/27: 54, 11/03/20: 70    Time 8    Period Weeks    Status Achieved    Target Date 11/03/20      PT LONG TERM GOAL #3   Title Pt will increase R shoulder ER and IR to 90 deg ER and 70 deg IR in AROM to improve ADL.    Baseline 1/27: 75 ER and 65 IR AROM, 10/26/20:R ER AROM = 76 deg , PROM= 90 deg;   R IR AROM = 63 deg, PROM 75 deg,  L ER AROM= 91 deg,   L IR AROM = 76 deg, 11/02/20 R ER AROM = 76 deg  PROM= 90 deg,   R IR AROM = 63 deg, PROM 75 deg    Time 8    Period Weeks    Status Partially Met    Target Date 11/03/20      PT LONG TERM GOAL #4   Title Pt. will carry >10# object with no increase c/o R shoulder pain or limitations to improve household tasks.    Baseline IE: Pt.  reports difficulty and pain in R shoulder with carrying/ lifting objects. 10/26/20: able to amb 175f with 15# box with no increaed pain or difficulty.    Time 8    Period Weeks    Status Achieved    Target Date 10/26/20                 Plan - 11/03/20 1004    Clinical Impression Statement Pt presented with an improved FOTO score (54 to 70), UE strength (all below 4+ at IE and now all around 4+ to 5/5), improved carrying capacity (<10# at IE with pain to 15# with no pain) and decreased pain since initial evaluation. Improvements have increased pts tolerance to ADLs and pt is ready to be DC'ed to an HEP.    Examination-Activity Limitations Caring for [employer]   Examination-Participation Restrictions Cleaning;Community Activity;Driving;Laundry    Stability/Clinical Decision Making Evolving/Moderate complexity    Clinical Decision Making Low    Rehab Potential Good    PT Frequency 2x / week    PT Duration 8 weeks    PT Treatment/Interventions Cryotherapy;Electrical Stimulation;Moist Heat;Therapeutic activities;Therapeutic exercise;Neuromuscular re-education;Manual techniques;Passive range of motion;ADLs/Self Care Home Management;Functional mobility training;Patient/family education    PT Next Visit Plan DC to HEP today    PT Home Exercise Plan [email or web address] - R shoulder strengthening and piriformis stretch, seated march, and SL hip abd    Consulted and Agree with Plan of Care Patient           Patient will benefit from skilled therapeutic intervention in order to improve the following deficits and impairments:  Decreased range of motion,Decreased endurance,Impaired UE functional use,Decreased mobility,Decreased strength,Pain,Decreased activity tolerance  Visit Diagnosis: Shoulder joint stiffness, right     Problem List Patient Active Problem List   Diagnosis Date Noted  . Acute cystitis with hematuria 09/25/2017  . Breast pain,  left 07/02/2017   . Allergic rhinitis due to pollen 05/22/2012  . Senile atrophic vaginitis 06/20/2011  . Depression 01/27/2008  . Essential hypertension 01/27/2008   Pura Spice, PT, DPT # [phone] Kayleen Memos, SPT 11/03/2020, 1:25 PM  [facility] [address] [place], [place], [address] Phone: [phone]   Fax:  [phone]  Name: Sally Shea MRN: [id number] Date of Birth: September 29, 1945

## 2020-11-07 ENCOUNTER — Encounter: Payer: Medicare PPO | Admitting: Physical Therapy

## 2020-11-08 ENCOUNTER — Ambulatory Visit (INDEPENDENT_AMBULATORY_CARE_PROVIDER_SITE_OTHER): Payer: Medicare PPO

## 2020-11-08 ENCOUNTER — Other Ambulatory Visit: Payer: Self-pay

## 2020-11-08 VITALS — BP 120/68 | HR 92 | Temp 98.4°F | Wt 126.1 lb

## 2020-11-08 DIAGNOSIS — Z0000 Encounter for general adult medical examination without abnormal findings: Secondary | ICD-10-CM

## 2020-11-08 NOTE — Patient Instructions (Addendum)
Sally Shea , Thank you for taking time to come for your Medicare Wellness Visit. I appreciate your ongoing commitment to your health goals. Please review the following plan we discussed and let me know if I can assist you in the future.   Screening recommendations/referrals: Colonoscopy: Done 05/24/15 Mammogram: Done 12/09/19 Bone Density: Done 10/09/12 Recommended yearly ophthalmology/optometry visit for glaucoma screening and checkup Recommended yearly dental visit for hygiene and checkup  Vaccinations: Influenza vaccine: Done 06/22/20 Pneumococcal vaccine: Up to date Tdap vaccine: Up to date Shingles vaccine: Done 05/26/19   Covid-19:Completed 2/25, & 11/21/19  Advanced directives: Please bring a copy of your health care power of attorney and living will to the office at your convenience.  Conditions/risks identified: more exercise   Next appointment: Follow up in one year for your annual wellness visit    Preventive Care 65 Years and Older, Female Preventive care refers to lifestyle choices and visits with your health care provider that can promote health and wellness. What does preventive care include?  A yearly physical exam. This is also called an annual well check.  Dental exams once or twice a year.  Routine eye exams. Ask your health care provider how often you should have your eyes checked.  Personal lifestyle choices, including:  Daily care of your teeth and gums.  Regular physical activity.  Eating a healthy diet.  Avoiding tobacco and drug use.  Limiting alcohol use.  Practicing safe sex.  Taking low-dose aspirin every day.  Taking vitamin and mineral supplements as recommended by your health care provider. What happens during an annual well check? The services and screenings done by your health care provider during your annual well check will depend on your age, overall health, lifestyle risk factors, and family history of disease. Counseling  Your health  care provider may ask you questions about your:  Alcohol use.  Tobacco use.  Drug use.  Emotional well-being.  Home and relationship well-being.  Sexual activity.  Eating habits.  History of falls.  Memory and ability to understand (cognition).  Work and work Statistician.  Reproductive health. Screening  You may have the following tests or measurements:  Height, weight, and BMI.  Blood pressure.  Lipid and cholesterol levels. These may be checked every 5 years, or more frequently if you are over 69 years old.  Skin check.  Lung cancer screening. You may have this screening every year starting at age 27 if you have a 30-pack-year history of smoking and currently smoke or have quit within the past 15 years.  Fecal occult blood test (FOBT) of the stool. You may have this test every year starting at age 7.  Flexible sigmoidoscopy or colonoscopy. You may have a sigmoidoscopy every 5 years or a colonoscopy every 10 years starting at age 53.  Hepatitis C blood test.  Hepatitis B blood test.  Sexually transmitted disease (STD) testing.  Diabetes screening. This is done by checking your blood sugar (glucose) after you have not eaten for a while (fasting). You may have this done every 1-3 years.  Bone density scan. This is done to screen for osteoporosis. You may have this done starting at age 61.  Mammogram. This may be done every 1-2 years. Talk to your health care provider about how often you should have regular mammograms. Talk with your health care provider about your test results, treatment options, and if necessary, the need for more tests. Vaccines  Your health care provider may recommend certain vaccines, such  as:  Influenza vaccine. This is recommended every year.  Tetanus, diphtheria, and acellular pertussis (Tdap, Td) vaccine. You may need a Td booster every 10 years.  Zoster vaccine. You may need this after age 27.  Pneumococcal 13-valent conjugate  (PCV13) vaccine. One dose is recommended after age 19.  Pneumococcal polysaccharide (PPSV23) vaccine. One dose is recommended after age 36. Talk to your health care provider about which screenings and vaccines you need and how often you need them. This information is not intended to replace advice given to you by your health care provider. Make sure you discuss any questions you have with your health care provider. Document Released: 09/16/2015 Document Revised: 05/09/2016 Document Reviewed: 06/21/2015 Elsevier Interactive Patient Education  2017 [employer] Prevention in the Home Falls can cause injuries. They can happen to people of all ages. There are many things you can do to make your home safe and to help prevent falls. What can I do on the outside of my home?  Regularly fix the edges of walkways and driveways and fix any cracks.  Remove anything that might make you trip as you walk through a door, such as a raised step or threshold.  Trim any bushes or trees on the path to your home.  Use bright outdoor lighting.  Clear any walking paths of anything that might make someone trip, such as rocks or tools.  Regularly check to see if handrails are loose or broken. Make sure that both sides of any steps have handrails.  Any raised decks and porches should have guardrails on the edges.  Have any leaves, snow, or ice cleared regularly.  Use sand or salt on walking paths during winter.  Clean up any spills in your garage right away. This includes oil or grease spills. What can I do in the bathroom?  Use night lights.  Install grab bars by the toilet and in the tub and shower. Do not use towel bars as grab bars.  Use non-skid mats or decals in the tub or shower.  If you need to sit down in the shower, use a plastic, non-slip stool.  Keep the floor dry. Clean up any water that spills on the floor as soon as it happens.  Remove soap buildup in the tub or shower  regularly.  Attach bath mats securely with double-sided non-slip rug tape.  Do not have throw rugs and other things on the floor that can make you trip. What can I do in the bedroom?  Use night lights.  Make sure that you have a light by your bed that is easy to reach.  Do not use any sheets or blankets that are too big for your bed. They should not hang down onto the floor.  Have a firm chair that has side arms. You can use this for support while you get dressed.  Do not have throw rugs and other things on the floor that can make you trip. What can I do in the kitchen?  Clean up any spills right away.  Avoid walking on wet floors.  Keep items that you use a lot in easy-to-reach places.  If you need to reach something above you, use a strong step stool that has a grab bar.  Keep electrical cords out of the way.  Do not use floor polish or wax that makes floors slippery. If you must use wax, use non-skid floor wax.  Do not have throw rugs and other things on the  floor that can make you trip. What can I do with my stairs?  Do not leave any items on the stairs.  Make sure that there are handrails on both sides of the stairs and use them. Fix handrails that are broken or loose. Make sure that handrails are as long as the stairways.  Check any carpeting to make sure that it is firmly attached to the stairs. Fix any carpet that is loose or worn.  Avoid having throw rugs at the top or bottom of the stairs. If you do have throw rugs, attach them to the floor with carpet tape.  Make sure that you have a light switch at the top of the stairs and the bottom of the stairs. If you do not have them, ask someone to add them for you. What else can I do to help prevent falls?  Wear shoes that:  Do not have high heels.  Have rubber bottoms.  Are comfortable and fit you well.  Are closed at the toe. Do not wear sandals.  If you use a stepladder:  Make sure that it is fully  opened. Do not climb a closed stepladder.  Make sure that both sides of the stepladder are locked into place.  Ask someone to hold it for you, if possible.  Clearly mark and make sure that you can see:  Any grab bars or handrails.  First and last steps.  Where the edge of each step is.  Use tools that help you move around (mobility aids) if they are needed. These include:  Canes.  Walkers.  Scooters.  Crutches.  Turn on the lights when you go into a dark area. Replace any light bulbs as soon as they burn out.  Set up your furniture so you have a clear path. Avoid moving your furniture around.  If any of your floors are uneven, fix them.  If there are any pets around you, be aware of where they are.  Review your medicines with your doctor. Some medicines can make you feel dizzy. This can increase your chance of falling. Ask your doctor what other things that you can do to help prevent falls. This information is not intended to replace advice given to you by your health care provider. Make sure you discuss any questions you have with your health care provider. Document Released: 06/16/2009 Document Revised: 01/26/2016 Document Reviewed: 09/24/2014 Elsevier Interactive Patient Education  2017 [employer].

## 2020-11-08 NOTE — Progress Notes (Signed)
Subjective:   Sally Shea is a 75 y.o. female who presents for an Initial Medicare Annual Wellness Visit.  Review of Systems     Cardiac Risk Factors include: advanced age (>63men, >7 women);hypertension;dyslipidemia     Objective:    Today's Vitals   11/08/20 1422  BP: 120/68  Pulse: 92  Temp: 98.4 F (36.9 C)  SpO2: 97%  Weight: 126 lb 1.6 oz (57.2 kg)   Body mass index is 23.83 kg/m.  Advanced Directives 11/08/2020 06/24/2019 05/24/2015 03/23/2015  Does Patient Have a Medical Advance Directive? Yes No Yes Yes  Type of Paramedic of [place];Living will - - [place];Living will  Copy of [place] in Chart? No - copy requested - No - copy requested -    Current Medications (verified) Outpatient Encounter Medications as of 11/08/2020  Medication Sig  . aspirin 81 MG tablet Take 81 mg by mouth daily.  . calcium citrate-vitamin D (CITRACAL+D) 315-200 MG-UNIT per tablet Take 1 tablet by mouth daily.  . fish oil-omega-3 fatty acids 1000 MG capsule Take 1 g by mouth daily.  . pantoprazole (PROTONIX) 40 MG tablet Take 1 tablet (40 mg total) by mouth daily. Please schedule a yearly follow up for further refills. Thank you  . sertraline (ZOLOFT) 50 MG tablet TAKE 1 TABLET BY MOUTH EVERY DAY  . sucralfate (CARAFATE) 1 GM/10ML suspension Take 10 mLs (1 g total) by mouth every 6 (six) hours as needed.  . triamterene-hydrochlorothiazide (DYAZIDE) 37.5-25 MG capsule TAKE 1 EACH (1 CAPSULE TOTAL) BY MOUTH DAILY.  Marland Kitchen conjugated estrogens (PREMARIN) vaginal cream Place 0.5 Applicatorfuls vaginally daily. (Patient not taking: Reported on 11/08/2020)  . [DISCONTINUED] rosuvastatin (CRESTOR) 20 MG tablet Take 1 tablet (20 mg total) by mouth daily. (Patient not taking: Reported on 11/08/2020)   No facility-administered encounter medications on file as of 11/08/2020.    Allergies (verified) Sulfamethoxazole   History: Past Medical  History:  Diagnosis Date  . Allergy    seasonal  . Anemia   . Anxiety   . Blood transfusion without reported diagnosis    years ago  . Degenerative cervical disc   . Depression   . GERD (gastroesophageal reflux disease)   . Headache(784.0)   . Hypertension    Past Surgical History:  Procedure Laterality Date  . COLONOSCOPY    . EYE SURGERY    . TUBAL LIGATION     Family History  Problem Relation Age of Onset  . Heart disease Mother   . Alcohol abuse Other   . Anxiety disorder Other   . Depression Other   . Hyperlipidemia Other   . Hypertension Other   . Colon cancer Neg Hx   . Esophageal cancer Neg Hx   . Stomach cancer Neg Hx   . Rectal cancer Neg Hx    Social History   Socioeconomic History  . Marital status: Married    Spouse name: Not on file  . Number of children: Not on file  . Years of education: Not on file  . Highest education level: Not on file  Occupational History  . Not on file  Tobacco Use  . Smoking status: Never Smoker  . Smokeless tobacco: Never Used  Vaping Use  . Vaping Use: Never used  Substance and Sexual Activity  . Alcohol use: No  . Drug use: No  . Sexual activity: Not on file  Other Topics Concern  . Not on file  Social History Narrative  . Not on file   Social Determinants of Health   Financial Resource Strain: Low Risk   . Difficulty of Paying Living Expenses: Not hard at all  Food Insecurity: No Food Insecurity  . Worried About Charity fundraiser in the Last Year: Never true  . Ran Out of Food in the Last Year: Never true  Transportation Needs: No Transportation Needs  . Lack of Transportation (Medical): No  . Lack of Transportation (Non-Medical): No  Physical Activity: Sufficiently Active  . Days of Exercise per Week: 7 days  . Minutes of Exercise per Session: 30 min  Stress: No Stress Concern Present  . Feeling of Stress : Not at all  Social Connections: Socially Integrated  . Frequency of Communication with  Friends and Family: More than three times a week  . Frequency of Social Gatherings with Friends and Family: More than three times a week  . Attends Religious Services: More than 4 times per year  . Active Member of Clubs or Organizations: Yes  . Attends Archivist Meetings: 1 to 4 times per year  . Marital Status: Married    Tobacco Counseling Counseling given: Not Answered   Clinical Intake:  Pre-visit preparation completed: Yes  Pain : No/denies pain     BMI - recorded: 23.83 Nutritional Status: BMI of 19-24  Normal Nutritional Risks: None Diabetes: No  How often do you need to have someone help you when you read instructions, pamphlets, or other written materials from your doctor or pharmacy?: 1 - Never  Diabetic?No Interpreter Needed?: No  Information entered by :: Charlott Rakes, LPN   Activities of Daily Living In your present state of health, do you have any difficulty performing the following activities: 11/08/2020  Hearing? N  Vision? N  Difficulty concentrating or making decisions? N  Walking or climbing stairs? N  Dressing or bathing? N  Doing errands, shopping? N  Preparing Food and eating ? N  Using the Toilet? N  In the past six months, have you accidently leaked urine? N  Do you have problems with loss of bowel control? N  Managing your Medications? N  Managing your Finances? N  Housekeeping or managing your Housekeeping? N  Some recent data might be hidden    Patient Care Team: Dorothyann Peng, NP as PCP - General (Family Medicine) [place], [facility] (Neurosurgery)  Indicate any recent Medical Services you may have received from other than [facility] providers in the past year (date may be approximate).     Assessment:   This is a routine wellness examination for [name].  Hearing/Vision screen  Hearing Screening   125Hz  250Hz  500Hz  1000Hz  2000Hz  3000Hz  4000Hz  6000Hz  8000Hz   Right ear:           Left ear:            Comments: Pt denies any hearing issues   Vision Screening Comments: T follows up with [facility] for annual exams  Dietary issues and exercise activities discussed: Current Exercise Habits: Home exercise routine, Type of exercise: Other - see comments (gym), Time (Minutes): 30, Frequency (Times/Week): 7, Weekly Exercise (Minutes/Week): 210  Goals    . Patient Stated     Get more exercise in      Depression Screen PHQ 2/9 Scores 11/08/2020 07/22/2020 06/22/2020 07/18/2016 01/27/2015 01/06/2014  PHQ - 2 Score 0 0 0 0 0 0  PHQ- 9 Score - - 0 - - -  Fall Risk Fall Risk  11/08/2020 07/22/2020 06/22/2020 07/18/2016 01/27/2015  Falls in the past year? 1 1 1  No No  Number falls in past yr: 1 1 0 - -  Injury with Fall? 1 0 1 - -  Risk for fall due to : Impaired balance/gait History of fall(s) History of fall(s) - -  Follow up Falls prevention discussed Falls evaluation completed Falls evaluation completed - -    FALL RISK PREVENTION PERTAINING TO THE HOME:  Any stairs in or around the home? No  If so, are there any without handrails? No  Home free of loose throw rugs in walkways, pet beds, electrical cords, etc? Yes  Adequate lighting in your home to reduce risk of falls? Yes   ASSISTIVE DEVICES UTILIZED TO PREVENT FALLS:  Life alert? No  Use of a cane, walker or w/c? No  Grab bars in the bathroom? Yes  Shower chair or bench in shower? Yes  Elevated toilet seat or a handicapped toilet? Yes   TIMED UP AND GO:  Was the test performed? Yes .  Length of time to ambulate 10 feet: 10 sec.   Gait steady and fast without use of assistive device  Cognitive Function:     6CIT Screen 11/08/2020  What Year? 0 points  What month? 0 points  Count back from 20 0 points  Months in reverse 0 points  Repeat phrase 0 points    Immunizations Immunization History  Administered Date(s) Administered  . Fluad Quad(high Dose 65+) 06/22/2020  . Influenza Split 06/20/2011, 05/22/2012,  05/27/2012  . Influenza Whole 06/04/2008, 09/15/2009, 06/12/2010  . Influenza, High Dose Seasonal PF 06/16/2013, 06/29/2015, 07/24/2017, 07/15/2018, 05/20/2019  . Influenza,inj,Quad PF,6+ Mos 05/03/2014  . Influenza-Unspecified 04/25/2016  . Moderna Sars-Covid-2 Vaccination 07/30/2020  . PFIZER(Purple Top)SARS-COV-2 Vaccination 10/29/2019, 11/21/2019  . Pneumococcal Conjugate-13 01/06/2014  . Pneumococcal Polysaccharide-23 07/22/2012  . Td 11/05/2001  . Tdap 06/11/2014  . Zoster 03/29/2008  . Zoster Recombinat (Shingrix) 05/26/2019    TDAP status: Up to date  Flu Vaccine status: Up to date  Pneumococcal vaccine status: Up to date  Covid-19 vaccine status: Completed vaccines  Qualifies for Shingles Vaccine? Yes   Zostavax completed Yes   Shingrix Completed?: Yes  Screening Tests Health Maintenance  Topic Date Due  . MAMMOGRAM  12/08/2021  . TETANUS/TDAP  06/11/2024  . COLONOSCOPY (Pts 45-63yrs Insurance coverage will need to be confirmed)  05/23/2025  . INFLUENZA VACCINE  Completed  . DEXA SCAN  Completed  . COVID-19 Vaccine  Completed  . Hepatitis C Screening  Completed  . PNA vac Low Risk Adult  Completed  . HPV VACCINES  Aged Out  . URINE MICROALBUMIN  Discontinued    Health Maintenance  There are no preventive care reminders to display for this patient.  Colorectal cancer screening: Type of screening: Colonoscopy. Completed 05/24/15. Repeat every 10 years  Mammogram status: Completed 12/09/19. Repeat every year  Bone Density status: Completed 10/09/12. Results reflect: Bone density results: NORMAL. Repeat every 2 years.  Additional Screening:  Hepatitis C Screening:  Completed 07/24/17  Vision Screening: Recommended annual ophthalmology exams for early detection of glaucoma and other disorders of the eye. Is the patient up to date with their annual eye exam?  Yes  Who is the provider or what is the name of the office in which the patient attends annual eye  exams? [facility] If pt is not established with a provider, would they like to be referred to a  provider to establish care? No .   Dental Screening: Recommended annual dental exams for proper oral hygiene  Community Resource Referral / Chronic Care Management: CRR required this visit?  No   CCM required this visit?  No      Plan:     I have personally reviewed and noted the following in the patient's chart:   . Medical and social history . Use of alcohol, tobacco or illicit drugs  . Current medications and supplements . Functional ability and status . Nutritional status . Physical activity . Advanced directives . List of other physicians . Hospitalizations, surgeries, and ER visits in previous 12 months . Vitals . Screenings to include cognitive, depression, and falls . Referrals and appointments  In addition, I have reviewed and discussed with patient certain preventive protocols, quality metrics, and best practice recommendations. A written personalized care plan for preventive services as well as general preventive health recommendations were provided to patient.     Willette Brace, LPN   11/03/9177   Nurse Notes: pt wants allergy to sulfa removed stating it was a long time ago and she feels she is not now.

## 2020-11-10 ENCOUNTER — Encounter: Payer: Medicare PPO | Admitting: Physical Therapy

## 2020-11-14 ENCOUNTER — Encounter: Payer: Medicare PPO | Admitting: Physical Therapy

## 2020-11-17 ENCOUNTER — Encounter: Payer: Medicare PPO | Admitting: Physical Therapy

## 2020-12-03 ENCOUNTER — Other Ambulatory Visit: Payer: Self-pay | Admitting: Gastroenterology

## 2020-12-06 ENCOUNTER — Other Ambulatory Visit: Payer: Self-pay | Admitting: Adult Health

## 2020-12-06 DIAGNOSIS — F329 Major depressive disorder, single episode, unspecified: Secondary | ICD-10-CM

## 2020-12-23 ENCOUNTER — Other Ambulatory Visit: Payer: Self-pay | Admitting: Adult Health

## 2020-12-23 ENCOUNTER — Ambulatory Visit (INDEPENDENT_AMBULATORY_CARE_PROVIDER_SITE_OTHER): Payer: Medicare PPO

## 2020-12-23 ENCOUNTER — Other Ambulatory Visit: Payer: Self-pay

## 2020-12-23 ENCOUNTER — Ambulatory Visit: Payer: Medicare PPO | Admitting: Adult Health

## 2020-12-23 ENCOUNTER — Encounter: Payer: Self-pay | Admitting: Adult Health

## 2020-12-23 VITALS — BP 130/76 | HR 71 | Temp 98.1°F | Ht 61.0 in | Wt 125.4 lb

## 2020-12-23 DIAGNOSIS — M545 Low back pain, unspecified: Secondary | ICD-10-CM

## 2020-12-23 DIAGNOSIS — M791 Myalgia, unspecified site: Secondary | ICD-10-CM

## 2020-12-23 DIAGNOSIS — M25551 Pain in right hip: Secondary | ICD-10-CM

## 2020-12-23 LAB — BASIC METABOLIC PANEL
BUN: 17 mg/dL (ref 6–23)
CO2: 32 mEq/L (ref 19–32)
Calcium: 9.5 mg/dL (ref 8.4–10.5)
Chloride: 100 mEq/L (ref 96–112)
Creatinine, Ser: 0.75 mg/dL (ref 0.40–1.20)
GFR: 78.63 mL/min (ref >60.00)
Glucose, Bld: 81 mg/dL (ref 70–99)
Potassium: 3.7 mEq/L (ref 3.5–5.1)
Sodium: 140 mEq/L (ref 135–145)

## 2020-12-23 LAB — C-REACTIVE PROTEIN: CRP: <1 mg/dL (ref 0.5–20.0)

## 2020-12-23 LAB — SEDIMENTATION RATE: Sed Rate: 9 mm/hr (ref 0–30)

## 2020-12-23 LAB — VITAMIN D 25 HYDROXY (VIT D DEFICIENCY, FRACTURES): VITD: 45.82 ng/mL (ref 30.00–100.00)

## 2020-12-23 NOTE — Progress Notes (Signed)
Subjective:    Patient ID: Sally Shea, female    DOB: [birth year], 75 y.o.   MRN: [id number]  HPI 75 year old female who  has a past medical history of Allergy, Anemia, Anxiety, Blood transfusion without reported diagnosis, Degenerative cervical disc, Depression, GERD (gastroesophageal reflux disease), Headache(784.0), and Hypertension.  She presents to the office today for two separate issues   1. Right hip and low back pain-back in October 2021 she fell onto a hard tile floor, when she fell she fell onto her right hip.  Went through physical therapy which she reports did not hurt but did not do much for improvement.  She continues to have right hip and right lower back pain.  Pain is worse with change in positions such as going from a sitting to a standing position.  Does feel as though she has a catch in her right hip when she changes positions.  Continues to be able to perform ADLs and stays active but pain in hip and back is starting to become more bothersome   2.  Muscle aching-she reports that over the last 6 months she has had muscle aching and bilateral upper arms.  Aching is constant and is felt with and without palpation.  She denies strenuous exercise or weightbearing exercises that would cause the achiness to be present.  She does eat healthy and stays active.  Taking vitamin B12 supplement. Denies changes in urine color. No fevers, chills, rashes   Review of Systems See HPI   . Past Medical History:  Diagnosis Date  . Allergy    seasonal  . Anemia   . Anxiety   . Blood transfusion without reported diagnosis    years ago  . Degenerative cervical disc   . Depression   . GERD (gastroesophageal reflux disease)   . Headache(784.0)   . Hypertension     Social History   Socioeconomic History  . Marital status: Married    Spouse name: Not on file  . Number of children: Not on file  . Years of education: Not on file  . Highest education level: Not on file  Occupational  History  . Not on file  Tobacco Use  . Smoking status: Never Smoker  . Smokeless tobacco: Never Used  Vaping Use  . Vaping Use: Never used  Substance and Sexual Activity  . Alcohol use: No  . Drug use: No  . Sexual activity: Not on file  Other Topics Concern  . Not on file  Social History Narrative  . Not on file   Social Determinants of Health   Financial Resource Strain: Low Risk   . Difficulty of Paying Living Expenses: Not hard at all  Food Insecurity: No Food Insecurity  . Worried About Charity fundraiser in the Last Year: Never true  . Ran Out of Food in the Last Year: Never true  Transportation Needs: No Transportation Needs  . Lack of Transportation (Medical): No  . Lack of Transportation (Non-Medical): No  Physical Activity: Sufficiently Active  . Days of Exercise per Week: 7 days  . Minutes of Exercise per Session: 30 min  Stress: No Stress Concern Present  . Feeling of Stress : Not at all  Social Connections: Socially Integrated  . Frequency of Communication with Friends and Family: More than three times a week  . Frequency of Social Gatherings with Friends and Family: More than three times a week  . Attends Religious Services: More than 4 times  per year  . Active Member of Clubs or Organizations: Yes  . Attends Archivist Meetings: 1 to 4 times per year  . Marital Status: Married  Human resources officer Violence: Not At Risk  . Fear of Current or Ex-Partner: No  . Emotionally Abused: No  . Physically Abused: No  . Sexually Abused: No    Past Surgical History:  Procedure Laterality Date  . COLONOSCOPY    . EYE SURGERY    . TUBAL LIGATION      Family History  Problem Relation Age of Onset  . Heart disease Mother   . Alcohol abuse Other   . Anxiety disorder Other   . Depression Other   . Hyperlipidemia Other   . Hypertension Other   . Colon cancer Neg Hx   . Esophageal cancer Neg Hx   . Stomach cancer Neg Hx   . Rectal cancer Neg Hx      No Active Allergies  Current Outpatient Medications on File Prior to Visit  Medication Sig Dispense Refill  . aspirin 81 MG tablet Take 81 mg by mouth daily.    . calcium citrate-vitamin D (CITRACAL+D) 315-200 MG-UNIT per tablet Take 1 tablet by mouth daily.    Marland Kitchen conjugated estrogens (PREMARIN) vaginal cream Place 0.5 Applicatorfuls vaginally daily. 42.5 g 11  . fish oil-omega-3 fatty acids 1000 MG capsule Take 1 g by mouth daily.    . pantoprazole (PROTONIX) 40 MG tablet Take 1 tablet (40 mg total) by mouth daily. Please schedule a yearly follow up for further refills. Thank you 90 tablet 0  . sertraline (ZOLOFT) 50 MG tablet TAKE 1 TABLET BY MOUTH EVERY DAY 90 tablet 1  . sucralfate (CARAFATE) 1 GM/10ML suspension Take 10 mLs (1 g total) by mouth every 6 (six) hours as needed. 240 mL 3  . triamterene-hydrochlorothiazide (DYAZIDE) 37.5-25 MG capsule TAKE 1 EACH (1 CAPSULE TOTAL) BY MOUTH DAILY. 90 capsule 3   No current facility-administered medications on file prior to visit.    BP 130/76 (BP Location: Right Arm, Patient Position: Sitting, Cuff Size: Normal)   Pulse 71   Temp 98.1 F (36.7 C) (Oral)   Ht 5\' 1"  (1.549 m)   Wt 125 lb 6.4 oz (56.9 kg)   SpO2 96%   BMI 23.69 kg/m       Objective:   Physical Exam Vitals and nursing note reviewed.  Constitutional:      Appearance: Normal appearance.  Abdominal:     General: Abdomen is flat.     Palpations: Abdomen is soft.  Musculoskeletal:        General: Normal range of motion.     Right upper arm: Tenderness present. No swelling, edema, deformity, lacerations or bony tenderness.     Left upper arm: Tenderness present. No swelling, edema, deformity, lacerations or bony tenderness.     Lumbar back: Normal.     Right hip: Normal.     Left hip: Normal.     Comments: Tenderness present with palpation throughout upper arms    Neurological:     Mental Status: She is alert.  Psychiatric:        Mood and Affect: Mood  normal.        Behavior: Behavior normal.        Thought Content: Thought content normal.        Judgment: Judgment normal.       Assessment & Plan:  1. Acute right-sided low back pain without sciatica  -  DG Lumbar Spine Complete; Future  2. Right hip pain - Right hip and pelvis xray  - Likely need MRI   3. Muscle ache - Unknown cause  - VITAMIN D 25 Hydroxy (Vit-D Deficiency, Fractures); Future - ANA; Future - Sedimentation Rate; Future - C-reactive Protein; Future - Basic Metabolic Panel; Future   [employer]   Time spent on chart review, time with patient; discussion joint pain and muscle ache, reatment, follow up plan, and documentation 30 minutes

## 2020-12-23 NOTE — Addendum Note (Signed)
Addended by: Tessie Fass D on: 12/23/2020 09:28 AM   Modules accepted: Orders

## 2020-12-26 LAB — ANA: Anti Nuclear Antibody (ANA): NEGATIVE

## 2020-12-27 ENCOUNTER — Other Ambulatory Visit: Payer: Self-pay | Admitting: Adult Health

## 2020-12-27 DIAGNOSIS — M545 Low back pain, unspecified: Secondary | ICD-10-CM

## 2021-01-10 ENCOUNTER — Other Ambulatory Visit: Payer: Self-pay | Admitting: Adult Health

## 2021-01-10 DIAGNOSIS — Z1231 Encounter for screening mammogram for malignant neoplasm of breast: Secondary | ICD-10-CM

## 2021-02-03 DIAGNOSIS — M4316 Spondylolisthesis, lumbar region: Secondary | ICD-10-CM | POA: Diagnosis not present

## 2021-02-17 ENCOUNTER — Other Ambulatory Visit: Payer: Self-pay | Admitting: Gastroenterology

## 2021-03-07 ENCOUNTER — Other Ambulatory Visit: Payer: Self-pay

## 2021-03-07 ENCOUNTER — Ambulatory Visit
Admission: RE | Admit: 2021-03-07 | Discharge: 2021-03-07 | Disposition: A | Discharge status: Home / Self Care | Payer: Medicare PPO | Source: Ambulatory Visit | Attending: Adult Health | Admitting: Adult Health

## 2021-03-07 DIAGNOSIS — Z1231 Encounter for screening mammogram for malignant neoplasm of breast: Secondary | ICD-10-CM | POA: Diagnosis not present

## 2021-03-28 ENCOUNTER — Other Ambulatory Visit: Payer: Self-pay | Admitting: Adult Health

## 2021-03-28 ENCOUNTER — Telehealth: Payer: Self-pay

## 2021-03-28 NOTE — Telephone Encounter (Signed)
Pt stated that she has been out of pantoprazole and was wondering why the medication has been refused from our office. Pt advised that the medication is filled by another provider. Pt stated she does not know the provider who sent it in is is wondering if [name] will take over medication.

## 2021-03-28 NOTE — Telephone Encounter (Signed)
Patient notified of update  and verbalized understanding. 

## 2021-04-21 ENCOUNTER — Other Ambulatory Visit: Payer: Self-pay | Admitting: Adult Health

## 2021-04-21 ENCOUNTER — Other Ambulatory Visit: Payer: Self-pay | Admitting: Gastroenterology

## 2021-04-21 DIAGNOSIS — I10 Essential (primary) hypertension: Secondary | ICD-10-CM

## 2021-05-04 DIAGNOSIS — Z85828 Personal history of other malignant neoplasm of skin: Secondary | ICD-10-CM | POA: Diagnosis not present

## 2021-05-04 DIAGNOSIS — Z01419 Encounter for gynecological examination (general) (routine) without abnormal findings: Secondary | ICD-10-CM | POA: Diagnosis not present

## 2021-05-04 DIAGNOSIS — D485 Neoplasm of uncertain behavior of skin: Secondary | ICD-10-CM | POA: Diagnosis not present

## 2021-05-04 DIAGNOSIS — D2272 Melanocytic nevi of left lower limb, including hip: Secondary | ICD-10-CM | POA: Diagnosis not present

## 2021-05-04 DIAGNOSIS — Z1151 Encounter for screening for human papillomavirus (HPV): Secondary | ICD-10-CM | POA: Diagnosis not present

## 2021-05-04 DIAGNOSIS — N950 Postmenopausal bleeding: Secondary | ICD-10-CM | POA: Diagnosis not present

## 2021-05-04 DIAGNOSIS — L821 Other seborrheic keratosis: Secondary | ICD-10-CM | POA: Diagnosis not present

## 2021-05-04 DIAGNOSIS — N8111 Cystocele, midline: Secondary | ICD-10-CM | POA: Diagnosis not present

## 2021-05-04 DIAGNOSIS — Z124 Encounter for screening for malignant neoplasm of cervix: Secondary | ICD-10-CM | POA: Diagnosis not present

## 2021-05-04 DIAGNOSIS — Z01411 Encounter for gynecological examination (general) (routine) with abnormal findings: Secondary | ICD-10-CM | POA: Diagnosis not present

## 2021-05-04 DIAGNOSIS — L814 Other melanin hyperpigmentation: Secondary | ICD-10-CM | POA: Diagnosis not present

## 2021-05-04 DIAGNOSIS — Z6823 Body mass index (BMI) 23.0-23.9, adult: Secondary | ICD-10-CM | POA: Diagnosis not present

## 2021-05-08 IMAGING — DX DG HIP (WITH OR WITHOUT PELVIS) 2-3V*R*
2 series · 2 of 2 positions shown · non-contrast
Comparison: None.

CLINICAL DATA: Right hip pain.

EXAM:
DG HIP (WITH OR WITHOUT PELVIS) 2-3V RIGHT

[pelvis ap]
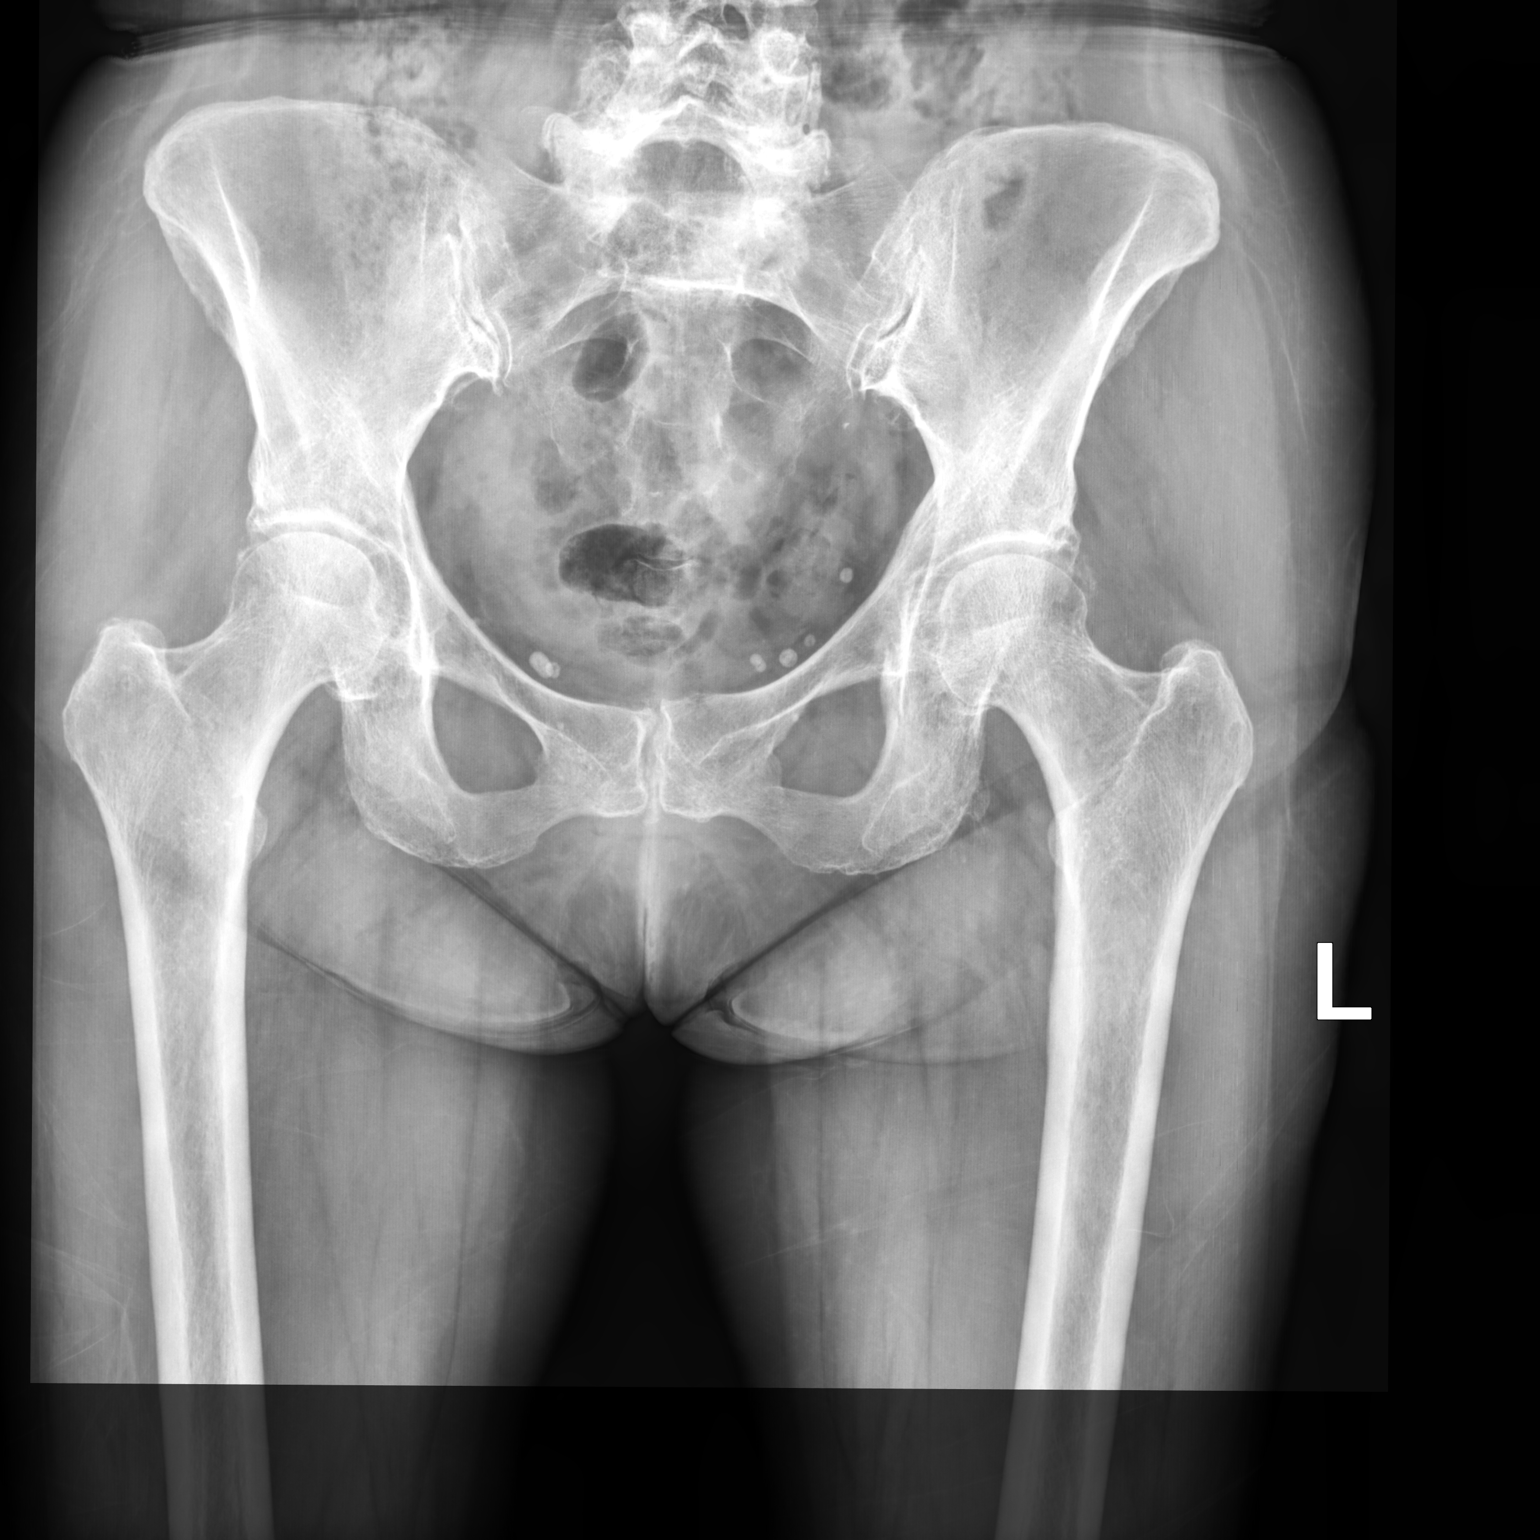

[hip (frog leg) lat]
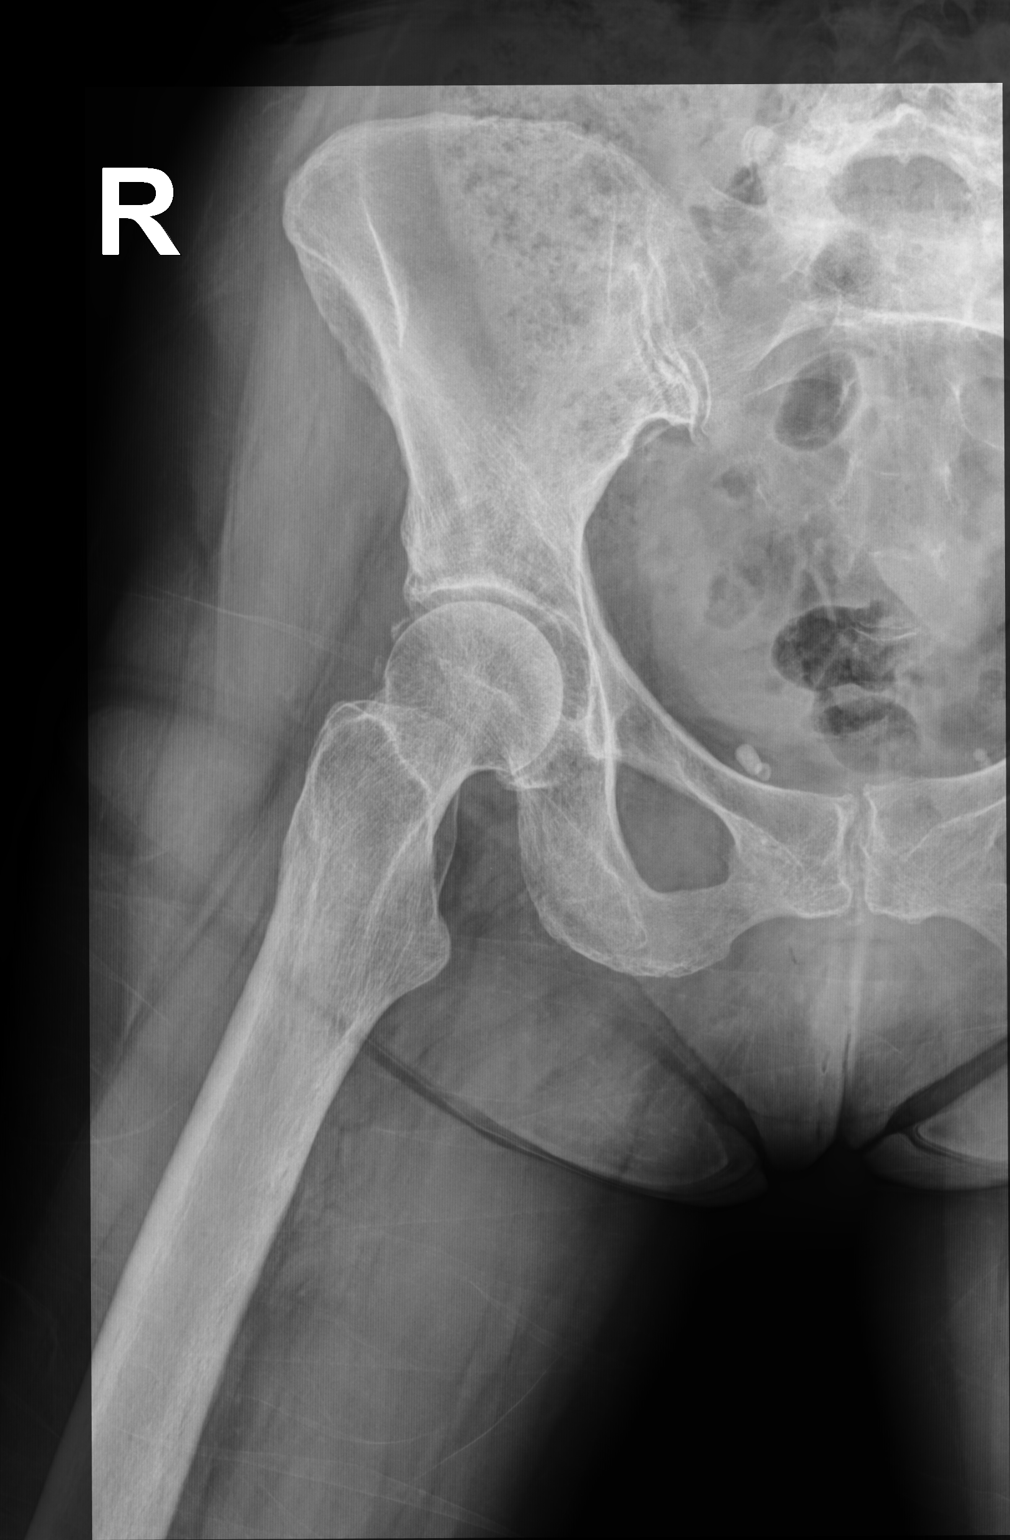

[2 of 2 positions shown; findings below may reference images not displayed]

FINDINGS: There is no evidence of hip fracture or dislocation. Mild
degenerative changes seen in the form of joint space narrowing and
acetabular sclerosis. Small phleboliths are seen within the pelvis.
IMPRESSION: Degenerative changes without an acute osseous abnormality.

## 2021-05-16 ENCOUNTER — Other Ambulatory Visit: Payer: Self-pay | Admitting: Gastroenterology

## 2021-06-01 ENCOUNTER — Other Ambulatory Visit: Payer: Self-pay | Admitting: Adult Health

## 2021-06-01 DIAGNOSIS — F329 Major depressive disorder, single episode, unspecified: Secondary | ICD-10-CM

## 2021-06-02 ENCOUNTER — Ambulatory Visit: Payer: Medicare PPO | Admitting: Gastroenterology

## 2021-06-02 ENCOUNTER — Encounter: Payer: Self-pay | Admitting: Gastroenterology

## 2021-06-02 VITALS — BP 122/71 | HR 85 | Ht 61.0 in | Wt 125.1 lb

## 2021-06-02 DIAGNOSIS — Z79899 Other long term (current) drug therapy: Secondary | ICD-10-CM

## 2021-06-02 DIAGNOSIS — K449 Diaphragmatic hernia without obstruction or gangrene: Secondary | ICD-10-CM

## 2021-06-02 DIAGNOSIS — K2100 Gastro-esophageal reflux disease with esophagitis, without bleeding: Secondary | ICD-10-CM | POA: Diagnosis not present

## 2021-06-02 NOTE — Progress Notes (Signed)
HPI :   75 year old female here for follow-up visit for GERD/esophagitis.  I last saw her and November 2020 for an upper endoscopy.  At that time she was noted to have a 5 cm hiatal hernia with esophagitis/distal esophageal ulcer associated with peptic stricture.  She had balloon dilation of the GE J stricture to 15.5 mm with a good result.  I had recommended Protonix 40 mg daily at that time for treatment of this, as well as office follow-up.  I have not seen her since that time.  She states she has been taking Protonix 40 mg on average every other day or so.  She states that this dose this generally controls her symptoms and she feels much better.  She states she feels significantly improved since the last time I seen her.  She generally does not have any breakthrough heartburn on the regimen, if she misses a dose she may take some Carafate as needed which can help.  She states the dilation resolved her dysphagia and that has not recurred.  No nausea or vomiting.  She is eating well.  No postprandial abdominal pains.  She denies any blood in her stools.  She does have a history of ulcers remotely but none noted on her recent endoscopy.  She is avoiding NSAIDs and using Tylenol as needed.  Denies any family history of esophageal or gastric cancer.  Otherwise feels well today without complaints.  No cardiopulmonary symptoms.  She inquires about long-term risks of chronic PPI use and need for follow-up endoscopy.  Of note she has normal renal function and she denies any history of osteoporosis or osteopenia.  EGD 07/14/2019 -  - A 5 cm hiatal hernia was present. - One benign-appearing, intrinsic moderate stenosis was found 30 cm from the incisors. This stenosis measured less than one cm (in length). The stenosis was traversed. A TTS dilator was passed through the scope. Dilation with a 13.5-14.5-15.5 mm balloon dilator was performed to 13.5 mm, 14.5 mm and 15.5 mm after which multiple appropriate  mucosal wrents were noted. There was slight nodularity noted at the 6 o clock position at the GEJ which appeared benign / inflammatory. Biopsies were taken with a cold forceps for histology. - Esophagitis was found at the GEJ with ulceration. - The exam of the esophagus was otherwise normal. - The entire examined stomach was normal. - The duodenal bulb and second portion of the duodenum were normal.  Diagnosis Surgical [P], esophagus, GE junction - ESOPHAGEAL SQUAMOUS AND CARDIAC MUCOSA WITH NO SPECIFIC HISTOPATHOLOGIC CHANGES - NEGATIVE FOR INTESTINAL METAPLASIA OR DYSPLASIA    Colonoscopy 05/24/2015 - normal - no polyps        Past Medical History:  Diagnosis Date   Allergy    seasonal   Anemia    Anxiety    Blood transfusion without reported diagnosis    years ago   Degenerative cervical disc    Depression    GERD (gastroesophageal reflux disease)    Headache(784.0)    Hypertension      Past Surgical History:  Procedure Laterality Date   COLONOSCOPY     EYE SURGERY     TUBAL LIGATION     Family History  Problem Relation Age of Onset   Heart disease Mother    Alcohol abuse Other    Anxiety disorder Other    Depression Other    Hyperlipidemia Other    Hypertension Other    Colon cancer Neg Hx  Esophageal cancer Neg Hx    Stomach cancer Neg Hx    Rectal cancer Neg Hx    Social History   Tobacco Use   Smoking status: Never   Smokeless tobacco: Never  Vaping Use   Vaping Use: Never used  Substance Use Topics   Alcohol use: No   Drug use: No   Current Outpatient Medications  Medication Sig Dispense Refill   aspirin 81 MG tablet Take 81 mg by mouth daily.     calcium citrate-vitamin D (CITRACAL+D) 315-200 MG-UNIT per tablet Take 1 tablet by mouth daily.     conjugated estrogens (PREMARIN) vaginal cream Place 0.5 Applicatorfuls vaginally daily. 42.5 g 11   fish oil-omega-3 fatty acids 1000 MG capsule Take 1 g by mouth daily.     pantoprazole  (PROTONIX) 40 MG tablet Take 1 tablet (40 mg total) by mouth daily. Please keep your September appointment for further refills. Thank you 30 tablet 1   sertraline (ZOLOFT) 50 MG tablet TAKE 1 TABLET BY MOUTH EVERY DAY 90 tablet 1   sucralfate (CARAFATE) 1 GM/10ML suspension Take 10 mLs (1 g total) by mouth every 6 (six) hours as needed. 240 mL 3   triamterene-hydrochlorothiazide (DYAZIDE) 37.5-25 MG capsule TAKE 1 EACH (1 CAPSULE TOTAL) BY MOUTH DAILY. 90 capsule PRN   No current facility-administered medications for this visit.   No Active Allergies   Review of Systems: All systems reviewed and negative except where noted in HPI.   Lab Results  Component Value Date   WBC 5.2 07/22/2020   HGB 14.2 07/22/2020   HCT 42.5 07/22/2020   MCV 92.2 07/22/2020   PLT 359 07/22/2020    Lab Results  Component Value Date   CREATININE 0.75 12/23/2020   BUN 17 12/23/2020   NA 140 12/23/2020   K 3.7 12/23/2020   CL 100 12/23/2020   CO2 32 12/23/2020     Physical Exam: BP 122/71   Pulse 85   Ht 5\' 1"  (1.549 m)   Wt 125 lb 2 oz (56.8 kg)   BMI 23.64 kg/m  Constitutional: Pleasant,well-developed, female in no acute distress. Neurological: Alert and oriented to person place and time. Psychiatric: Normal mood and affect. Behavior is normal.   ASSESSMENT AND PLAN: 75 year old female here for reassessment of following:  GERD with esophagitis Hiatal hernia Long-term use of PPI  We discussed the patient's course.  She had moderate esophagitis with esophageal ulcer associated with peptic stricture on prior EGD, in the setting of moderate size hiatal hernia.  On Protonix she feels remarkably better and this has significantly improved her quality of life.  She is quite happy with the regimen.  40 mg dose every other day seems to work fairly well for her with minimal breakthrough.  We did discuss long-term risks of chronic PPI use to include increased risk for chronic kidney disease, bone  fracture, C. difficile, potential increased risk for gastric cancer although this is quite low.  I do think she will warrant long-term use of PPI given the size of her hiatal hernia and chronicity of symptoms, she does have breakthrough if she misses any dosing of her Protonix.  We otherwise discussed utility of a follow-up endoscopy.  I think that is reasonable to ensure no evidence of Barrett's esophagus and she has had mucosal healing on Protonix.  I discussed risk benefits of endoscopy and anesthesia and she wants to proceed further recommendations pending results.  If she has mucosal healing on present dosing  of Protonix, long-term want to use the lowest dose of PPI needed to control her symptoms and will reduce to 20 mg in that setting.  She agrees.  Further recommendations pending results.  Plan: - EGD - assess for mucosal healing on current dosing of protonix, rule out Barrett's - continue present dosing of protonix for now. Discussed long term risks / benefits - trial of reducing dose of protonix if she has mucosal healing on EGD, long term use lowest dose of PPI needed to control symptoms  Jolly Mango, MD [facility]

## 2021-06-02 NOTE — Patient Instructions (Addendum)
If you are age 74 or older, your body mass index should be between 23-30. Your Body mass index is 23.64 kg/m. If this is out of the aforementioned range listed, please consider follow up with your Primary Care Provider.  If you are age 35 or younger, your body mass index should be between 19-25. Your Body mass index is 23.64 kg/m. If this is out of the aformentioned range listed, please consider follow up with your Primary Care Provider.   __________________________________________________________  The [facility] providers would like to encourage you to use [facility] to communicate with providers for non-urgent requests or questions.  Due to long hold times on the telephone, sending your provider a message by [facility] may be a faster and more efficient way to get a response.  Please allow 48 business hours for a response.  Please remember that this is for non-urgent requests.   You have been scheduled for an endoscopy. Please follow written instructions given to you at your visit today. If you use inhalers (even only as needed), please bring them with you on the day of your procedure.  Please continue Protonix.  Thank you for entrusting me with your care and for choosing [facility], Dr.  Cellar

## 2021-06-06 ENCOUNTER — Other Ambulatory Visit: Payer: Self-pay

## 2021-06-06 ENCOUNTER — Ambulatory Visit (AMBULATORY_SURGERY_CENTER): Payer: Medicare PPO | Admitting: Gastroenterology

## 2021-06-06 ENCOUNTER — Encounter: Payer: Self-pay | Admitting: Gastroenterology

## 2021-06-06 VITALS — BP 123/77 | HR 74 | Temp 97.7°F | Resp 13 | Ht 61.0 in | Wt 125.0 lb

## 2021-06-06 DIAGNOSIS — K449 Diaphragmatic hernia without obstruction or gangrene: Secondary | ICD-10-CM | POA: Diagnosis not present

## 2021-06-06 DIAGNOSIS — K222 Esophageal obstruction: Secondary | ICD-10-CM

## 2021-06-06 DIAGNOSIS — K208 Other esophagitis without bleeding: Secondary | ICD-10-CM | POA: Diagnosis not present

## 2021-06-06 DIAGNOSIS — K2100 Gastro-esophageal reflux disease with esophagitis, without bleeding: Secondary | ICD-10-CM

## 2021-06-06 DIAGNOSIS — K2970 Gastritis, unspecified, without bleeding: Secondary | ICD-10-CM | POA: Diagnosis not present

## 2021-06-06 DIAGNOSIS — K225 Diverticulum of esophagus, acquired: Secondary | ICD-10-CM

## 2021-06-06 DIAGNOSIS — K219 Gastro-esophageal reflux disease without esophagitis: Secondary | ICD-10-CM

## 2021-06-06 MED ORDER — PANTOPRAZOLE SODIUM 40 MG PO TBEC: 40.0000 mg | DELAYED_RELEASE_TABLET | Freq: Every day | ORAL | 3 refills | Status: DC

## 2021-06-06 MED ORDER — SODIUM CHLORIDE 0.9 % IV SOLN: 500.0000 mL | Freq: Once | INTRAVENOUS | Status: DC

## 2021-06-06 NOTE — Progress Notes (Signed)
Pt Drowsy. VSS. To PACU, report to RN. No anesthetic complications noted.  

## 2021-06-06 NOTE — Patient Instructions (Signed)
Thank you for letting us take care of your healthcare needs today Please see handouts given to you on Esophageal Stricture.    YOU HAD AN ENDOSCOPIC PROCEDURE TODAY AT [facility]:   Refer to the procedure report that was given to you for any specific questions about what was found during the examination.  If the procedure report does not answer your questions, please call your gastroenterologist to clarify.  If you requested that your care partner not be given the details of your procedure findings, then the procedure report has been included in a sealed envelope for you to review at your convenience later.  YOU SHOULD EXPECT: Some feelings of bloating in the abdomen. Passage of more gas than usual.  Walking can help get rid of the air that was put into your GI tract during the procedure and reduce the bloating. If you had a lower endoscopy (such as a colonoscopy or flexible sigmoidoscopy) you may notice spotting of blood in your stool or on the toilet paper. If you underwent a bowel prep for your procedure, you may not have a normal bowel movement for a few days.  Please Note:  You might notice some irritation and congestion in your nose or some drainage.  This is from the oxygen used during your procedure.  There is no need for concern and it should clear up in a day or so.  SYMPTOMS TO REPORT IMMEDIATELY:  Following upper endoscopy (EGD)  Vomiting of blood or coffee ground material  New chest pain or pain under the shoulder blades  Painful or persistently difficult swallowing  New shortness of breath  Fever of 100F or higher  Black, tarry-looking stools  For urgent or emergent issues, a gastroenterologist can be reached at any hour by calling [phone]. Do not use MyChart messaging for urgent concerns.    DIET:  We do recommend a small meal at first, but then you may proceed to your regular diet.  Drink plenty of fluids but you should avoid alcoholic beverages for  24 hours.  ACTIVITY:  You should plan to take it easy for the rest of today and you should NOT DRIVE or use heavy machinery until tomorrow (because of the sedation medicines used during the test).    FOLLOW UP: Our staff will call the number listed on your records 48-72 hours following your procedure to check on you and address any questions or concerns that you may have regarding the information given to you following your procedure. If we do not reach you, we will leave a message.  We will attempt to reach you two times.  During this call, we will ask if you have developed any symptoms of COVID 19. If you develop any symptoms (ie: fever, flu-like symptoms, shortness of breath, cough etc.) before then, please call [phone].  If you test positive for Covid 19 in the 2 weeks post procedure, please call and report this information to [place].    If any biopsies were taken you will be contacted by phone or by letter within the next 1-3 weeks.  Please call us at [phone] if you have not heard about the biopsies in 3 weeks.    SIGNATURES/CONFIDENTIALITY: You and/or your care partner have signed paperwork which will be entered into your electronic medical record.  These signatures attest to the fact that that the information above on your After Visit Summary has been reviewed and is understood.  Full responsibility of the confidentiality of  this discharge information lies with you and/or your care-partner.

## 2021-06-06 NOTE — Progress Notes (Signed)
History and Physical Interval Note: Patient seen on 06/02/21 for follow up of esophagitis and dysphagia. Doing much better on protonix since I have seen her. I asked her again about any dysphagia and in the clinic she denied this , but today states she has this mildly periodically, much better than previous but requesting dilation if any overt stricture noted. Will see what we find endoscopically, assess for mucosal healing and reassess size of hernia. I have discussed risks / benefits with her and she wishes to proceed. Otherwise no interval changes since I have seen her.  06/06/2021 7:52 AM  Sally Shea  has presented today for endoscopic procedure(s), with the diagnosis of  Encounter Diagnoses  Name Primary?   Gastroesophageal reflux disease with esophagitis without hemorrhage Yes   Hiatal hernia   .  The various methods of evaluation and treatment have been discussed with the patient and/or family. After consideration of risks, benefits and other options for treatment, the patient has consented to  the endoscopic procedure(s).   The patient's history has been reviewed, patient examined, no change in status, stable for surgery.  I have reviewed the patient's chart and labs.  Questions were answered to the patient's satisfaction.    Jolly Mango, MD [facility]

## 2021-06-06 NOTE — Progress Notes (Signed)
Called to room to assist during endoscopic procedure.  Patient ID and intended procedure confirmed with present staff. Received instructions for my participation in the procedure from the performing physician.  

## 2021-06-06 NOTE — Op Note (Signed)
[name] Procedure Date: 06/06/2021 7:25 AM MRN: [id number] Endoscopist: Remo Lipps P. Havery Moros , MD Age: 75 Referring MD:  Date of Birth: 08-Dec-1945 Gender: Female Account #: [id number] Procedure:                Upper GI endoscopy Indications:              Follow-up of reflux esophagitis, Follow-up of                            esophageal stricture from 07/2019 - on protonix                            40mg  every other day with resolution of symptoms of                            reflux. Mild intermittent dysphagia. Patient hoping                            to reduce dose of PPI if possible Medicines:                Monitored Anesthesia Care Procedure:                Pre-Anesthesia Assessment:                           - Prior to the procedure, a History and Physical                            was performed, and patient medications and                            allergies were reviewed. The patient's tolerance of                            previous anesthesia was also reviewed. The risks                            and benefits of the procedure and the sedation                            options and risks were discussed with the patient.                            All questions were answered, and informed consent                            was obtained. Prior Anticoagulants: The patient has                            taken no previous anticoagulant or antiplatelet                            agents. ASA Grade Assessment: II - A patient with  mild systemic disease. After reviewing the risks                            and benefits, the patient was deemed in                            satisfactory condition to undergo the procedure.                           After obtaining informed consent, the endoscope was                            passed under direct vision. Throughout the                            procedure, the patient's  blood pressure, pulse, and                            oxygen saturations were monitored continuously. The                            Endoscope was introduced through the mouth, and                            advanced to the second part of duodenum. The upper                            GI endoscopy was accomplished without difficulty.                            The patient tolerated the procedure well. Scope In: Scope Out: Findings:                 Esophagogastric landmarks were identified: the                            Z-line was found at 30 cm, the gastroesophageal                            junction was found at 31 cm and the upper extent of                            the gastric folds was found at 36 cm from the                            incisors.                           A 5 cm hiatal hernia was present.                           The Z-line was irregular with extension of salmon  colored mucosa extending up about 1cm or so from                            the GEJ. Biopsies were taken with a cold forceps                            for histology. There was interval healing of                            esophagitis on protonix.                           One benign-appearing, intrinsic mild stenosis was                            found 31 cm from the incisors. This stenosis                            measured less than one cm (in length). A TTS                            dilator was passed through the scope. Dilation with                            a 13.5-14.5-15.5 mm balloon dilator was performed                            to 15.5 mm.                           A diverticulum with a small opening was found at                            the gastroesophageal junction.                           The exam of the esophagus was otherwise normal.                           The entire examined stomach was normal.                           The duodenal bulb and second  portion of the                            duodenum were normal. Complications:            No immediate complications. Estimated blood loss:                            Minimal. Estimated Blood Loss:     Estimated blood loss was minimal. Impression:               - Esophagogastric landmarks identified.                           -  5 cm hiatal hernia.                           - Interval healing of esophagitis on protonix, but                            Z-line irregular. Biopsied.                           - Benign-appearing esophageal stenosis. Dilated to                            15.75mm with good result.                           - Small diverticulum at the gastroesophageal                            junction.                           - Normal stomach.                           - Normal duodenal bulb and second portion of the                            duodenum. Recommendation:           - Patient has a contact number available for                            emergencies. The signs and symptoms of potential                            delayed complications were discussed with the                            patient. Return to normal activities tomorrow.                            Written discharge instructions were provided to the                            patient.                           - Advance diet as tolerated.                           - Continue present medications.                           - Await pathology results with further                            recommendations. Remo Lipps P. Havery Moros, MD 06/06/2021 8:20:26 AM This report has been signed electronically.

## 2021-06-08 ENCOUNTER — Telehealth: Payer: Self-pay

## 2021-06-08 NOTE — Telephone Encounter (Signed)
  Follow up Call-  Call back number 06/06/2021 07/14/2019  Post procedure Call Back phone  # [phone]  Permission to leave phone message Yes Yes  Some recent data might be hidden     Patient questions:  Do you have a fever, pain , or abdominal swelling? No. Pain Score  0 *  Have you tolerated food without any problems? Yes.    Have you been able to return to your normal activities? Yes.    Do you have any questions about your discharge instructions: Diet   No. Medications  No. Follow up visit  No.  Do you have questions or concerns about your Care? No.  Actions: * If pain score is 4 or above: No action needed, pain <4.

## 2021-06-14 DIAGNOSIS — R519 Headache, unspecified: Secondary | ICD-10-CM | POA: Diagnosis not present

## 2021-06-14 DIAGNOSIS — D320 Benign neoplasm of cerebral meninges: Secondary | ICD-10-CM | POA: Diagnosis not present

## 2021-06-16 DIAGNOSIS — R030 Elevated blood-pressure reading, without diagnosis of hypertension: Secondary | ICD-10-CM | POA: Diagnosis not present

## 2021-06-16 DIAGNOSIS — D320 Benign neoplasm of cerebral meninges: Secondary | ICD-10-CM | POA: Diagnosis not present

## 2021-07-25 ENCOUNTER — Encounter: Payer: Self-pay | Admitting: Adult Health

## 2021-07-25 ENCOUNTER — Ambulatory Visit (INDEPENDENT_AMBULATORY_CARE_PROVIDER_SITE_OTHER): Payer: Medicare PPO | Admitting: Adult Health

## 2021-07-25 VITALS — BP 110/80 | HR 73 | Temp 98.3°F | Ht 60.0 in | Wt 123.0 lb

## 2021-07-25 DIAGNOSIS — F32A Depression, unspecified: Secondary | ICD-10-CM

## 2021-07-25 DIAGNOSIS — I10 Essential (primary) hypertension: Secondary | ICD-10-CM

## 2021-07-25 DIAGNOSIS — K219 Gastro-esophageal reflux disease without esophagitis: Secondary | ICD-10-CM

## 2021-07-25 DIAGNOSIS — E782 Mixed hyperlipidemia: Secondary | ICD-10-CM

## 2021-07-25 DIAGNOSIS — F419 Anxiety disorder, unspecified: Secondary | ICD-10-CM | POA: Diagnosis not present

## 2021-07-25 DIAGNOSIS — Z0000 Encounter for general adult medical examination without abnormal findings: Secondary | ICD-10-CM | POA: Diagnosis not present

## 2021-07-25 LAB — LIPID PANEL
Cholesterol: 183 mg/dL (ref 0–200)
HDL: 57.3 mg/dL (ref >39.00)
LDL Cholesterol: 103 mg/dL — ABNORMAL HIGH (ref 0–99)
NonHDL: 125.32
Total CHOL/HDL Ratio: 3
Triglycerides: 113 mg/dL (ref 0.0–149.0)
VLDL: 22.6 mg/dL (ref 0.0–40.0)

## 2021-07-25 LAB — CBC WITH DIFFERENTIAL/PLATELET
Basophils Absolute: 0.1 10*3/uL (ref 0.0–0.1)
Basophils Relative: 1 % (ref 0.0–3.0)
Eosinophils Absolute: 0.1 10*3/uL (ref 0.0–0.7)
Eosinophils Relative: 2.2 % (ref 0.0–5.0)
HCT: 38.8 % (ref 36.0–46.0)
Hemoglobin: 13.1 g/dL (ref 12.0–15.0)
Lymphocytes Relative: 27.7 % (ref 12.0–46.0)
Lymphs Abs: 1.7 10*3/uL (ref 0.7–4.0)
MCHC: 33.8 g/dL (ref 30.0–36.0)
MCV: 90.1 fl (ref 78.0–100.0)
Monocytes Absolute: 0.6 10*3/uL (ref 0.1–1.0)
Monocytes Relative: 10.1 % (ref 3.0–12.0)
Neutro Abs: 3.7 10*3/uL (ref 1.4–7.7)
Neutrophils Relative %: 59 % (ref 43.0–77.0)
Platelets: 339 10*3/uL (ref 150.0–400.0)
RBC: 4.31 Mil/uL (ref 3.87–5.11)
RDW: 12.6 % (ref 11.5–15.5)
WBC: 6.2 10*3/uL (ref 4.0–10.5)

## 2021-07-25 LAB — COMPREHENSIVE METABOLIC PANEL
ALT: 16 U/L (ref 0–35)
AST: 20 U/L (ref 0–37)
Albumin: 4.4 g/dL (ref 3.5–5.2)
Alkaline Phosphatase: 64 U/L (ref 39–117)
BUN: 16 mg/dL (ref 6–23)
CO2: 32 mEq/L (ref 19–32)
Calcium: 9.4 mg/dL (ref 8.4–10.5)
Chloride: 100 mEq/L (ref 96–112)
Creatinine, Ser: 0.76 mg/dL (ref 0.40–1.20)
GFR: 77.08 mL/min (ref >60.00)
Glucose, Bld: 89 mg/dL (ref 70–99)
Potassium: 3.7 mEq/L (ref 3.5–5.1)
Sodium: 139 mEq/L (ref 135–145)
Total Bilirubin: 0.6 mg/dL (ref 0.2–1.2)
Total Protein: 7 g/dL (ref 6.0–8.3)

## 2021-07-25 LAB — HEMOGLOBIN A1C: Hgb A1c MFr Bld: 6 % (ref 4.6–6.5)

## 2021-07-25 LAB — TSH: TSH: 1.69 u[IU]/mL (ref 0.35–5.50)

## 2021-07-25 NOTE — Progress Notes (Signed)
Subjective:    Patient ID: Sally Shea, female    DOB: 1946-02-04, 75 y.o.   MRN: [id number]  HPI Patient presents for yearly preventative medicine examination. She is a pleasant and active 75 year old female who  has a past medical history of Allergy, Anemia, Anxiety, Blood transfusion without reported diagnosis, Degenerative cervical disc, Depression, GERD (gastroesophageal reflux disease), Headache(784.0), and Hypertension.  Hypertension-prescribed Dyazide 37.5-25 mg daily.  Blood pressures have been well controlled on this medication.  She denies dizziness, lightheadedness, chest pain, shortness of breath, or syncopal episodes BP Readings from Last 3 Encounters:  07/25/21 110/80  06/06/21 123/77  06/02/21 122/71   Anxiety/depression-is well controlled with Zoloft 50 mg daily  GERD-currently on Protonix 40 mg daily.  She does feel well controlled on this medication  Hyperlipidemia-currently prescribed Crestor 20 mg weekly. She denies myalgia or fatigue when she takes it weekly.  Lab Results  Component Value Date   CHOL 177 07/22/2020   HDL 67 07/22/2020   LDLCALC 95 07/22/2020   LDLDIRECT 176.0 06/13/2011   TRIG 65 07/22/2020   CHOLHDL 2.6 07/22/2020   All immunizations and health maintenance protocols were reviewed with the patient and needed orders were placed.  Appropriate screening laboratory values were ordered for the patient including screening of hyperlipidemia, renal function and hepatic function.   Medication reconciliation,  past medical history, social history, problem list and allergies were reviewed in detail with the patient  Goals were established with regard to weight loss, exercise, and  diet in compliance with medications  Wt Readings from Last 3 Encounters:  07/25/21 123 lb (55.8 kg)  06/06/21 125 lb (56.7 kg)  06/02/21 125 lb 2 oz (56.8 kg)   Up to date on colon cancer screening and mammograms.   Review of Systems  Constitutional: Negative.    HENT: Negative.    Eyes: Negative.   Respiratory: Negative.    Cardiovascular: Negative.   Gastrointestinal: Negative.   Endocrine: Negative.   Genitourinary: Negative.   Musculoskeletal: Negative.   Skin: Negative.   Allergic/Immunologic: Negative.   Neurological: Negative.   Hematological: Negative.   Psychiatric/Behavioral: Negative.     Past Medical History:  Diagnosis Date   Allergy    seasonal   Anemia    Anxiety    Blood transfusion without reported diagnosis    years ago   Degenerative cervical disc    Depression    GERD (gastroesophageal reflux disease)    Headache(784.0)    Hypertension     Social History   Socioeconomic History   Marital status: Married    Spouse name: Not on file   Number of children: Not on file   Years of education: Not on file   Highest education level: Not on file  Occupational History   Not on file  Tobacco Use   Smoking status: Never   Smokeless tobacco: Never  Vaping Use   Vaping Use: Never used  Substance and Sexual Activity   Alcohol use: No   Drug use: No   Sexual activity: Not on file  Other Topics Concern   Not on file  Social History Narrative   Not on file   Social Determinants of Health   Financial Resource Strain: Low Risk    Difficulty of Paying Living Expenses: Not hard at all  Food Insecurity: No Food Insecurity   Worried About Running Out of Food in the Last Year: Never true   [place] in the Last  Year: Never true  Transportation Needs: No Transportation Needs   Lack of Transportation (Medical): No   Lack of Transportation (Non-Medical): No  Physical Activity: Sufficiently Active   Days of Exercise per Week: 7 days   Minutes of Exercise per Session: 30 min  Stress: No Stress Concern Present   Feeling of Stress : Not at all  Social Connections: Socially Integrated   Frequency of Communication with Friends and Family: More than three times a week   Frequency of Social Gatherings with Friends  and Family: More than three times a week   Attends Religious Services: More than 4 times per year   Active Member of [employer] or Organizations: Yes   Attends Archivist Meetings: 1 to 4 times per year   Marital Status: Married  Human resources officer Violence: Not At Risk   Fear of Current or Ex-Partner: No   Emotionally Abused: No   Physically Abused: No   Sexually Abused: No    Past Surgical History:  Procedure Laterality Date   COLONOSCOPY     EYE SURGERY     TUBAL LIGATION      Family History  Problem Relation Age of Onset   Heart disease Mother    Alcohol abuse Other    Anxiety disorder Other    Depression Other    Hyperlipidemia Other    Hypertension Other    Colon cancer Neg Hx    Esophageal cancer Neg Hx    Stomach cancer Neg Hx    Rectal cancer Neg Hx     No Active Allergies  Current Outpatient Medications on File Prior to Visit  Medication Sig Dispense Refill   aspirin 81 MG tablet Take 81 mg by mouth daily.     calcium citrate-vitamin D (CITRACAL+D) 315-200 MG-UNIT per tablet Take 1 tablet by mouth daily.     conjugated estrogens (PREMARIN) vaginal cream Place 0.5 Applicatorfuls vaginally daily. 42.5 g 11   fish oil-omega-3 fatty acids 1000 MG capsule Take 1 g by mouth daily.     pantoprazole (PROTONIX) 40 MG tablet Take 1 tablet (40 mg total) by mouth daily. 90 tablet 3   rosuvastatin (CRESTOR) 20 MG tablet Take 20 mg by mouth once a week.     sertraline (ZOLOFT) 50 MG tablet TAKE 1 TABLET BY MOUTH EVERY DAY 90 tablet 1   sucralfate (CARAFATE) 1 GM/10ML suspension Take 10 mLs (1 g total) by mouth every 6 (six) hours as needed. 240 mL 3   triamterene-hydrochlorothiazide (DYAZIDE) 37.5-25 MG capsule TAKE 1 EACH (1 CAPSULE TOTAL) BY MOUTH DAILY. 90 capsule PRN   [DISCONTINUED] pantoprazole (PROTONIX) 40 MG tablet Take 1 tablet (40 mg total) by mouth daily. Please keep your September appointment for further refills. Thank you 30 tablet 1   No current  facility-administered medications on file prior to visit.    BP 110/80   Pulse 73   Temp 98.3 F (36.8 C) (Oral)   Ht 5' (1.524 m)   Wt 123 lb (55.8 kg)   SpO2 98%   BMI 24.02 kg/m        Objective:   Physical Exam Vitals and nursing note reviewed.  Constitutional:      General: She is not in acute distress.    Appearance: Normal appearance. She is well-developed. She is not ill-appearing.  HENT:     Head: Normocephalic and atraumatic.     Right Ear: Tympanic membrane, ear canal and external ear normal. There is no impacted cerumen.  Left Ear: Tympanic membrane, ear canal and external ear normal. There is no impacted cerumen.     Nose: Nose normal. No congestion or rhinorrhea.     Mouth/Throat:     Mouth: Mucous membranes are moist.     Pharynx: Oropharynx is clear. No oropharyngeal exudate or posterior oropharyngeal erythema.  Eyes:     General:        Right eye: No discharge.        Left eye: No discharge.     Extraocular Movements: Extraocular movements intact.     Conjunctiva/sclera: Conjunctivae normal.     Pupils: Pupils are equal, round, and reactive to light.  Neck:     Thyroid: No thyromegaly.     Vascular: No carotid bruit.     Trachea: No tracheal deviation.  Cardiovascular:     Rate and Rhythm: Normal rate and regular rhythm.     Pulses: Normal pulses.     Heart sounds: Normal heart sounds. No murmur heard.   No friction rub. No gallop.  Pulmonary:     Effort: Pulmonary effort is normal. No respiratory distress.     Breath sounds: Normal breath sounds. No stridor. No wheezing, rhonchi or rales.  Chest:     Chest wall: No tenderness.  Abdominal:     General: Abdomen is flat. Bowel sounds are normal. There is no distension.     Palpations: Abdomen is soft. There is no mass.     Tenderness: There is no abdominal tenderness. There is no right CVA tenderness, left CVA tenderness, guarding or rebound.     Hernia: No hernia is present.  Musculoskeletal:         General: No swelling, tenderness, deformity or signs of injury. Normal range of motion.     Cervical back: Normal range of motion and neck supple.     Right lower leg: No edema.     Left lower leg: No edema.  Lymphadenopathy:     Cervical: No cervical adenopathy.  Skin:    General: Skin is warm and dry.     Coloration: Skin is not jaundiced or pale.     Findings: No bruising, erythema, lesion or rash.  Neurological:     General: No focal deficit present.     Mental Status: She is alert and oriented to person, place, and time.     Cranial Nerves: No cranial nerve deficit.     Sensory: No sensory deficit.     Motor: No weakness.     Coordination: Coordination normal.     Gait: Gait normal.     Deep Tendon Reflexes: Reflexes normal.  Psychiatric:        Mood and Affect: Mood normal.        Behavior: Behavior normal.        Thought Content: Thought content normal.        Judgment: Judgment normal.      Assessment & Plan:  1. Routine general medical examination at a health care facility - Benign exam  - Follow up in one year or sooner if needed - CBC with Differential/Platelet; Future - Comprehensive metabolic panel; Future - Lipid panel; Future - TSH; Future - Hemoglobin A1c; Future  2. Essential hypertension - well controlled.  - No change in medications  - CBC with Differential/Platelet; Future - Comprehensive metabolic panel; Future - Lipid panel; Future - TSH; Future - Hemoglobin A1c; Future  3. Mixed hyperlipidemia - Continue with statin weekly.  - CBC with Differential/Platelet;  Future - Comprehensive metabolic panel; Future - Lipid panel; Future - TSH; Future - Hemoglobin A1c; Future  4. Anxiety and depression - Continue with Zoloft 50 mg daily   5. Gastroesophageal reflux disease without esophagitis - Continue with Protonix 40 mg daily  - CBC with Differential/Platelet; Future - Comprehensive metabolic panel; Future - Lipid panel; Future - TSH;  Future   Dorothyann Peng, NP

## 2021-07-25 NOTE — Patient Instructions (Signed)
It was great seeing you today   We will follow up with you regarding your lab work   I will see you back in one year or sooner if needed

## 2021-10-03 DIAGNOSIS — N8111 Cystocele, midline: Secondary | ICD-10-CM | POA: Diagnosis not present

## 2021-10-03 DIAGNOSIS — Z4689 Encounter for fitting and adjustment of other specified devices: Secondary | ICD-10-CM | POA: Diagnosis not present

## 2021-10-10 ENCOUNTER — Telehealth: Payer: Self-pay | Admitting: Adult Health

## 2021-10-10 NOTE — Telephone Encounter (Signed)
Left message for patient to call back and schedule Medicare Annual Wellness Visit (AWV) either virtually or in office. Left  my Herbie Drape number [phone]   Last AWV 11/08/20 please schedule at anytime with [facility] Nurse Health Advisor 1 or 2   This should be a 45 minute visit.

## 2021-10-11 NOTE — Telephone Encounter (Signed)
Patient returned my call.  She stated she has a lot going on right now and would like a call back later this summer

## 2021-10-27 DIAGNOSIS — N8111 Cystocele, midline: Secondary | ICD-10-CM | POA: Diagnosis not present

## 2021-10-27 DIAGNOSIS — R39198 Other difficulties with micturition: Secondary | ICD-10-CM | POA: Diagnosis not present

## 2021-11-06 DIAGNOSIS — R350 Frequency of micturition: Secondary | ICD-10-CM | POA: Diagnosis not present

## 2021-11-07 ENCOUNTER — Encounter: Payer: Self-pay | Admitting: Adult Health

## 2021-11-07 ENCOUNTER — Ambulatory Visit (INDEPENDENT_AMBULATORY_CARE_PROVIDER_SITE_OTHER): Payer: Medicare PPO | Admitting: Adult Health

## 2021-11-07 ENCOUNTER — Other Ambulatory Visit: Payer: Self-pay | Admitting: Adult Health

## 2021-11-07 VITALS — BP 130/90 | HR 85 | Temp 98.5°F | Ht 60.0 in | Wt 123.0 lb

## 2021-11-07 DIAGNOSIS — R5383 Other fatigue: Secondary | ICD-10-CM | POA: Diagnosis not present

## 2021-11-07 DIAGNOSIS — R319 Hematuria, unspecified: Secondary | ICD-10-CM | POA: Diagnosis not present

## 2021-11-07 LAB — POCT URINALYSIS DIPSTICK
Bilirubin, UA: NEGATIVE
Blood, UA: NEGATIVE
Glucose, UA: NEGATIVE
Ketones, UA: NEGATIVE
Nitrite, UA: NEGATIVE
Protein, UA: NEGATIVE
Spec Grav, UA: 1.015 (ref 1.010–1.025)
Urobilinogen, UA: 0.2 E.U./dL
pH, UA: 7 (ref 5.0–8.0)

## 2021-11-07 LAB — CBC WITH DIFFERENTIAL/PLATELET
Basophils Absolute: 0.2 10*3/uL — ABNORMAL HIGH (ref 0.0–0.1)
Basophils Relative: 3.2 % — ABNORMAL HIGH (ref 0.0–3.0)
Eosinophils Absolute: 0.2 10*3/uL (ref 0.0–0.7)
Eosinophils Relative: 4.2 % (ref 0.0–5.0)
HCT: 40.1 % (ref 36.0–46.0)
Hemoglobin: 13.6 g/dL (ref 12.0–15.0)
Lymphocytes Relative: 33.9 % (ref 12.0–46.0)
Lymphs Abs: 1.8 10*3/uL (ref 0.7–4.0)
MCHC: 33.9 g/dL (ref 30.0–36.0)
MCV: 90.6 fl (ref 78.0–100.0)
Monocytes Absolute: 0.5 10*3/uL (ref 0.1–1.0)
Monocytes Relative: 9.4 % (ref 3.0–12.0)
Neutro Abs: 2.7 10*3/uL (ref 1.4–7.7)
Neutrophils Relative %: 49.3 % (ref 43.0–77.0)
Platelets: 390 10*3/uL (ref 150.0–400.0)
RBC: 4.42 Mil/uL (ref 3.87–5.11)
RDW: 13 % (ref 11.5–15.5)
WBC: 5.4 10*3/uL (ref 4.0–10.5)

## 2021-11-07 LAB — VITAMIN D 25 HYDROXY (VIT D DEFICIENCY, FRACTURES): VITD: 51.28 ng/mL (ref 30.00–100.00)

## 2021-11-07 LAB — IBC + FERRITIN
Ferritin: 36.7 ng/mL (ref 10.0–291.0)
Iron: 157 ug/dL — ABNORMAL HIGH (ref 42–145)
Saturation Ratios: 44 % (ref 20.0–50.0)
TIBC: 357 ug/dL (ref 250.0–450.0)
Transferrin: 255 mg/dL (ref 212.0–360.0)

## 2021-11-07 LAB — VITAMIN B12: Vitamin B-12: 392 pg/mL (ref 211–911)

## 2021-11-07 LAB — TSH: TSH: 3.16 u[IU]/mL (ref 0.35–5.50)

## 2021-11-07 NOTE — Patient Instructions (Signed)
Health Maintenance Due  ?Topic Date Due  ? Zoster Vaccines- Shingrix (2 of 2) 07/21/2019  ? COVID-19 Vaccine (4 - Booster) 09/24/2020  ? ? ?Depression screen [facility] 2/9 07/25/2021 11/08/2020 07/22/2020  ?Decreased Interest 0 0 0  ?Down, Depressed, Hopeless 0 0 0  ?PHQ - 2 Score 0 0 0  ?Altered sleeping 0 - -  ?Tired, decreased energy 0 - -  ?Change in appetite 0 - -  ?Feeling bad or failure about yourself  0 - -  ?Trouble concentrating 0 - -  ?Moving slowly or fidgety/restless 0 - -  ?Suicidal thoughts 0 - -  ?PHQ-9 Score 0 - -  ?Difficult doing work/chores Not difficult at all - -  ? ? ?

## 2021-11-07 NOTE — Telephone Encounter (Signed)
Okay for refill?  

## 2021-11-07 NOTE — Progress Notes (Signed)
? ?Subjective:  ? ? Patient ID: Sally Shea, female    DOB: Mar 01, 1946, 76 y.o.   MRN: [id number] ? ?HPI ?76 year old female who  has a past medical history of Allergy, Anemia, Anxiety, Blood transfusion without reported diagnosis, Degenerative cervical disc, Depression, GERD (gastroesophageal reflux disease), Headache(784.0), and Hypertension. ? ?She presents to the office today for concern of a UTI. She reports that 4 days ago she woke up in the morning and went to the bathroom and noticed " pure blood in my urine" for one episode.  ? ?Associated symptoms include urgency. She started a course of Bactrim that she had had home ( from an old prescription), took one tablet daily x 3 days.  ? ?She also reports fatigue, foggy headed feeling, loss of appetitive x 2 weeks. Believes she may have had Covid infection. Denies fevers, chills, n/v/d//c ? ? ?Review of Systems ?See HPI  ? ?Past Medical History:  ?Diagnosis Date  ? Allergy   ? seasonal  ? Anemia   ? Anxiety   ? Blood transfusion without reported diagnosis   ? years ago  ? Degenerative cervical disc   ? Depression   ? GERD (gastroesophageal reflux disease)   ? Headache(784.0)   ? Hypertension   ? ? ?Social History  ? ?Socioeconomic History  ? Marital status: Married  ?  Spouse name: Not on file  ? Number of children: Not on file  ? Years of education: Not on file  ? Highest education level: Bachelor's degree (e.g., BA, AB, BS)  ?Occupational History  ? Not on file  ?Tobacco Use  ? Smoking status: Never  ? Smokeless tobacco: Never  ?Vaping Use  ? Vaping Use: Never used  ?Substance and Sexual Activity  ? Alcohol use: No  ? Drug use: No  ? Sexual activity: Not on file  ?Other Topics Concern  ? Not on file  ?Social History Narrative  ? Not on file  ? ?Social Determinants of Health  ? ?Financial Resource Strain: Low Risk   ? Difficulty of Paying Living Expenses: Not hard at all  ?Food Insecurity: No Food Insecurity  ? Worried About Charity fundraiser in the Last Year:  Never true  ? Ran Out of Food in the Last Year: Never true  ?Transportation Needs: No Transportation Needs  ? Lack of Transportation (Medical): No  ? Lack of Transportation (Non-Medical): No  ?Physical Activity: Sufficiently Active  ? Days of Exercise per Week: 7 days  ? Minutes of Exercise per Session: 130 min  ?Stress: No Stress Concern Present  ? Feeling of Stress : Not at all  ?Social Connections: Socially Integrated  ? Frequency of Communication with Friends and Family: More than three times a week  ? Frequency of Social Gatherings with Friends and Family: More than three times a week  ? Attends Religious Services: More than 4 times per year  ? Active Member of Clubs or Organizations: Yes  ? Attends Archivist Meetings: More than 4 times per year  ? Marital Status: Married  ?Intimate Partner Violence: Not At Risk  ? Fear of Current or Ex-Partner: No  ? Emotionally Abused: No  ? Physically Abused: No  ? Sexually Abused: No  ? ? ?Past Surgical History:  ?Procedure Laterality Date  ? COLONOSCOPY    ? EYE SURGERY    ? TUBAL LIGATION    ? ? ?Family History  ?Problem Relation Age of Onset  ? Heart disease Mother   ?  Alcohol abuse Other   ? Anxiety disorder Other   ? Depression Other   ? Hyperlipidemia Other   ? Hypertension Other   ? Colon cancer Neg Hx   ? Esophageal cancer Neg Hx   ? Stomach cancer Neg Hx   ? Rectal cancer Neg Hx   ? ? ?No Active Allergies ? ?Current Outpatient Medications on File Prior to Visit  ?Medication Sig Dispense Refill  ? aspirin 81 MG tablet Take 81 mg by mouth daily.    ? calcium citrate-vitamin D (CITRACAL+D) 315-200 MG-UNIT per tablet Take 1 tablet by mouth daily.    ? conjugated estrogens (PREMARIN) vaginal cream Place 0.5 Applicatorfuls vaginally daily. 42.5 g 11  ? fish oil-omega-3 fatty acids 1000 MG capsule Take 1 g by mouth daily.    ? pantoprazole (PROTONIX) 40 MG tablet Take 1 tablet (40 mg total) by mouth daily. 90 tablet 3  ? rosuvastatin (CRESTOR) 20 MG tablet Take  20 mg by mouth once a week.    ? sertraline (ZOLOFT) 50 MG tablet TAKE 1 TABLET BY MOUTH EVERY DAY 90 tablet 1  ? sucralfate (CARAFATE) 1 GM/10ML suspension Take 10 mLs (1 g total) by mouth every 6 (six) hours as needed. 240 mL 3  ? triamterene-hydrochlorothiazide (DYAZIDE) 37.5-25 MG capsule TAKE 1 EACH (1 CAPSULE TOTAL) BY MOUTH DAILY. 90 capsule PRN  ? alprazolam (XANAX) 2 MG tablet Take by mouth.    ? [DISCONTINUED] pantoprazole (PROTONIX) 40 MG tablet Take 1 tablet (40 mg total) by mouth daily. Please keep your September appointment for further refills. Thank you 30 tablet 1  ? ?No current facility-administered medications on file prior to visit.  ? ? ?BP 130/90   Pulse 85   Temp 98.5 ?F (36.9 ?C) (Oral)   Ht 5' (1.524 m)   Wt 123 lb (55.8 kg)   SpO2 96%   BMI 24.02 kg/m?  ? ? ?   ?Objective:  ? Physical Exam ?Vitals and nursing note reviewed.  ?Constitutional:   ?   Appearance: Normal appearance.  ?Cardiovascular:  ?   Rate and Rhythm: Normal rate and regular rhythm.  ?   Pulses: Normal pulses.  ?   Heart sounds: Normal heart sounds.  ?Pulmonary:  ?   Effort: Pulmonary effort is normal.  ?   Breath sounds: Normal breath sounds.  ?Abdominal:  ?   General: Abdomen is flat. Bowel sounds are normal.  ?   Palpations: Abdomen is soft.  ?   Tenderness: There is no left CVA tenderness.  ?Musculoskeletal:  ?   Cervical back: No tenderness.  ?Lymphadenopathy:  ?   Cervical: No cervical adenopathy.  ?Skin: ?   General: Skin is warm and dry.  ?Neurological:  ?   General: No focal deficit present.  ?   Mental Status: She is alert and oriented to person, place, and time.  ?Psychiatric:     ?   Mood and Affect: Mood normal.     ?   Behavior: Behavior normal.     ?   Thought Content: Thought content normal.     ?   Judgment: Judgment normal.  ? ?   ?Assessment & Plan:  ?1. Hematuria, unspecified type ?- UA negative but was taking ABX.  ?- Discussed referral to urology - she refused at this time.  ?- POC Urinalysis  Dipstick ? ?2. Other fatigue ? ?- Vitamin B12; Future ?- VITAMIN D 25 Hydroxy (Vit-D Deficiency, Fractures); Future ?- CBC with Differential/Platelet; Future ?-  IBC + Ferritin; Future ?- TSH; Future ? ?Dorothyann Peng, NP ? ?

## 2021-11-08 ENCOUNTER — Telehealth: Payer: Self-pay | Admitting: Adult Health

## 2021-11-08 DIAGNOSIS — R319 Hematuria, unspecified: Secondary | ICD-10-CM | POA: Diagnosis not present

## 2021-11-08 DIAGNOSIS — Z87448 Personal history of other diseases of urinary system: Secondary | ICD-10-CM | POA: Diagnosis not present

## 2021-11-08 NOTE — Telephone Encounter (Signed)
Spoke to pt and she stated she has an appt. With her urologist and she stated she will speak to them about it. Pt stated she will call back and keep [name] updated on everything.  ?

## 2021-11-08 NOTE — Telephone Encounter (Signed)
Pt had an appt with cory on 11-07-2021 for UTI and would like cory to call her ?

## 2021-11-29 DIAGNOSIS — R8271 Bacteriuria: Secondary | ICD-10-CM | POA: Diagnosis not present

## 2021-11-29 DIAGNOSIS — R310 Gross hematuria: Secondary | ICD-10-CM | POA: Diagnosis not present

## 2021-12-04 ENCOUNTER — Other Ambulatory Visit: Payer: Self-pay | Admitting: Adult Health

## 2021-12-04 DIAGNOSIS — F329 Major depressive disorder, single episode, unspecified: Secondary | ICD-10-CM

## 2021-12-11 DIAGNOSIS — R319 Hematuria, unspecified: Secondary | ICD-10-CM | POA: Diagnosis not present

## 2021-12-11 DIAGNOSIS — R310 Gross hematuria: Secondary | ICD-10-CM | POA: Diagnosis not present

## 2022-01-08 DIAGNOSIS — H2513 Age-related nuclear cataract, bilateral: Secondary | ICD-10-CM | POA: Diagnosis not present

## 2022-01-08 DIAGNOSIS — D3132 Benign neoplasm of left choroid: Secondary | ICD-10-CM | POA: Diagnosis not present

## 2022-01-24 DIAGNOSIS — R310 Gross hematuria: Secondary | ICD-10-CM | POA: Diagnosis not present

## 2022-02-07 ENCOUNTER — Ambulatory Visit (INDEPENDENT_AMBULATORY_CARE_PROVIDER_SITE_OTHER): Payer: Medicare PPO

## 2022-02-07 VITALS — Ht 60.0 in | Wt 123.0 lb

## 2022-02-07 DIAGNOSIS — Z0000 Encounter for general adult medical examination without abnormal findings: Secondary | ICD-10-CM

## 2022-02-07 NOTE — Progress Notes (Signed)
Subjective:   Sally Shea is a 76 y.o. female who presents for Medicare Annual (Subsequent) preventive examination.  Review of Systems    Virtual Visit via Telephone Note  I connected with  Charisse March on 02/07/22 at 12:30 PM EDT by telephone and verified that I am speaking with the correct person using two identifiers.  Location: Patient: Home Provider: Office Persons participating in the virtual visit: patient/Nurse Health Advisor   I discussed the limitations, risks, security and privacy concerns of performing an evaluation and management service by telephone and the availability of in person appointments. The patient expressed understanding and agreed to proceed.  Interactive audio and video telecommunications were attempted between this nurse and patient, however failed, due to patient having technical difficulties OR patient did not have access to video capability.  We continued and completed visit with audio only.  Some vital signs may be absent or patient reported.   Criselda Peaches, LPN  Cardiac Risk Factors include: advanced age (>15mn, >>33women);hypertension     Objective:    Today's Vitals   02/07/22 1243  Weight: 123 lb (55.8 kg)  Height: 5' (1.524 m)   Body mass index is 24.02 kg/m.     02/07/2022   12:53 PM 11/08/2020    2:35 PM 06/24/2019    2:43 PM 05/24/2015    9:50 AM 03/23/2015    8:51 AM  Advanced Directives  Does Patient Have a Medical Advance Directive? Yes Yes No Yes Yes  Type of [place] will   [place] will  Does patient want to make changes to medical advance directive? No - Patient declined      Copy of HKalidain Chart? Yes - validated most recent copy scanned in chart (See row information) No - copy requested  No - copy requested     Current Medications (verified) Outpatient Encounter Medications as of  02/07/2022  Medication Sig   alprazolam (XANAX) 2 MG tablet Take by mouth.   aspirin 81 MG tablet Take 81 mg by mouth daily.   calcium citrate-vitamin D (CITRACAL+D) 315-200 MG-UNIT per tablet Take 1 tablet by mouth daily.   conjugated estrogens (PREMARIN) vaginal cream Place 0.5 Applicatorfuls vaginally daily.   fish oil-omega-3 fatty acids 1000 MG capsule Take 1 g by mouth daily.   pantoprazole (PROTONIX) 40 MG tablet Take 1 tablet (40 mg total) by mouth daily.   rosuvastatin (CRESTOR) 20 MG tablet Take 20 mg by mouth once a week.   sertraline (ZOLOFT) 50 MG tablet TAKE 1 TABLET BY MOUTH EVERY DAY   sucralfate (CARAFATE) 1 GM/10ML suspension TAKE 10 MLS (1 G TOTAL) BY MOUTH EVERY 6 (SIX) HOURS AS NEEDED.   triamterene-hydrochlorothiazide (DYAZIDE) 37.5-25 MG capsule TAKE 1 EACH (1 CAPSULE TOTAL) BY MOUTH DAILY.   [DISCONTINUED] pantoprazole (PROTONIX) 40 MG tablet Take 1 tablet (40 mg total) by mouth daily. Please keep your September appointment for further refills. Thank you   No facility-administered encounter medications on file as of 02/07/2022.    Allergies (verified) Patient has no active allergies.   History: Past Medical History:  Diagnosis Date   Allergy    seasonal   Anemia    Anxiety    Blood transfusion without reported diagnosis    years ago   Degenerative cervical disc    Depression    GERD (gastroesophageal reflux disease)    Headache(784.0)  Hypertension    Past Surgical History:  Procedure Laterality Date   COLONOSCOPY     EYE SURGERY     TUBAL LIGATION     Family History  Problem Relation Age of Onset   Heart disease Mother    Alcohol abuse Other    Anxiety disorder Other    Depression Other    Hyperlipidemia Other    Hypertension Other    Colon cancer Neg Hx    Esophageal cancer Neg Hx    Stomach cancer Neg Hx    Rectal cancer Neg Hx    Social History   Socioeconomic History   Marital status: Married    Spouse name: Not on file   Number of  children: Not on file   Years of education: Not on file   Highest education level: Bachelor's degree (e.g., BA, AB, BS)  Occupational History   Not on file  Tobacco Use   Smoking status: Never   Smokeless tobacco: Never  Vaping Use   Vaping Use: Never used  Substance and Sexual Activity   Alcohol use: No   Drug use: No   Sexual activity: Not on file  Other Topics Concern   Not on file  Social History Narrative   Not on file   Social Determinants of Health   Financial Resource Strain: Low Risk    Difficulty of Paying Living Expenses: Not hard at all  Food Insecurity: No Food Insecurity   Worried About Charity fundraiser in the Last Year: Never true   [place] in the Last Year: Never true  Transportation Needs: No Transportation Needs   Lack of Transportation (Medical): No   Lack of Transportation (Non-Medical): No  Physical Activity: Sufficiently Active   Days of Exercise per Week: 7 days   Minutes of Exercise per Session: 50 min  Stress: No Stress Concern Present   Feeling of Stress : Not at all  Social Connections: Socially Integrated   Frequency of Communication with Friends and Family: More than three times a week   Frequency of Social Gatherings with Friends and Family: More than three times a week   Attends Religious Services: More than 4 times per year   Active Member of [employer] or Organizations: Yes   Attends Archivist Meetings: 1 to 4 times per year   Marital Status: Married    Tobacco Counseling Counseling given: Not Answered   Clinical Intake:  Pre-visit preparation completed: Yes  Pain : No/denies pain     BMI - recorded: 24.02 Nutritional Status: BMI of 19-24  Normal Nutritional Risks: None Diabetes: No  How often do you need to have someone help you when you read instructions, pamphlets, or other written materials from your doctor or pharmacy?: 1 - Never  Diabetic?  No  Interpreter Needed?: No  Activities of Daily  Living    02/07/2022   12:52 PM 02/02/2022    2:10 PM  In your present state of health, do you have any difficulty performing the following activities:  Hearing? 0 0  Vision? 0 0  Difficulty concentrating or making decisions? 0 0  Walking or climbing stairs? 0 0  Dressing or bathing? 0 0  Doing errands, shopping? 0 0  Preparing Food and eating ? N N  Using the Toilet? N N  In the past six months, have you accidently leaked urine? N N  Do you have problems with loss of bowel control? N N  Managing your Medications?  N N  Managing your Finances? N N  Housekeeping or managing your Housekeeping? N N    Patient Care Team: Dorothyann Peng, NP as PCP - General (Family Medicine) [place], [facility] (Neurosurgery)  Indicate any recent Medical Services you may have received from other than [facility] providers in the past year (date may be approximate).     Assessment:   This is a routine wellness examination for Rosalee.  Hearing/Vision screen Hearing Screening - Comments:: No hearing difficulty Vision Screening - Comments:: Wears reading glasses. Followed by Dr Bing Plume  Dietary issues and exercise activities discussed: Exercise limited by: None identified   Goals Addressed               This Visit's Progress     Patient Stated (pt-stated)        Get more exercise in.       Depression Screen    02/07/2022   12:49 PM 11/07/2021    7:19 AM 07/25/2021    8:18 AM 11/08/2020    2:34 PM 07/22/2020    8:08 AM 06/22/2020    3:21 PM 07/18/2016    9:31 AM  PHQ 2/9 Scores  PHQ - 2 Score 0 0 0 0 0 0 0  PHQ- 9 Score  8 0   0     Fall Risk    02/07/2022   12:52 PM 02/02/2022    2:10 PM 11/06/2021   12:11 PM 07/25/2021    8:41 AM 11/08/2020    2:36 PM  Fall Risk   Falls in the past year? '1 1 1 1 1  '$ Number falls in past yr: 0 '1 1 1 1  '$ Injury with Fall? 0 0 0 0 1  Comment No injury or medical attention needed      Risk for fall due to : No Fall Risks    Impaired  balance/gait  Follow up     Falls prevention discussed    FALL RISK PREVENTION PERTAINING TO THE HOME:  Any stairs in or around the home? No  If so, are there any without handrails? No  Home free of loose throw rugs in walkways, pet beds, electrical cords, etc? Yes  Adequate lighting in your home to reduce risk of falls? Yes   ASSISTIVE DEVICES UTILIZED TO PREVENT FALLS:  Life alert? No  Use of a cane, walker or w/c? No  Grab bars in the bathroom? Yes  Shower chair or bench in shower? No  Elevated toilet seat or a handicapped toilet? No   TIMED UP AND GO:  Was the test performed? No . Audio Visit  Cognitive Function:        02/07/2022   12:54 PM 11/08/2020    2:40 PM  6CIT Screen  What Year? 0 points 0 points  What month? 0 points 0 points  What time? 0 points   Count back from 20 0 points 0 points  Months in reverse 0 points 0 points  Repeat phrase 0 points 0 points  Total Score 0 points     Immunizations Immunization History  Administered Date(s) Administered   Fluad Quad(high Dose 65+) 06/22/2020   Influenza Split 06/20/2011, 05/22/2012, 05/27/2012   Influenza Whole 06/04/2008, 09/15/2009, 06/12/2010   Influenza, High Dose Seasonal PF 06/16/2013, 06/29/2015, 07/24/2017, 07/15/2018, 05/20/2019, 06/21/2021   Influenza,inj,Quad PF,6+ Mos 05/03/2014   Influenza-Unspecified 04/25/2016   Moderna Sars-Covid-2 Vaccination 07/30/2020   PFIZER(Purple Top)SARS-COV-2 Vaccination 10/29/2019, 11/21/2019   Pneumococcal Conjugate-13 01/06/2014   Pneumococcal  Polysaccharide-23 07/22/2012   Td 11/05/2001   Tdap 06/11/2014   Zoster Recombinat (Shingrix) 05/26/2019, 05/26/2019   Zoster, Live 03/29/2008    TDAP status: Up to date  Flu Vaccine status: Up to date  Pneumococcal vaccine status: Up to date  Covid-19 vaccine status: Completed vaccines  Qualifies for Shingles Vaccine? Yes   Zostavax completed Yes   Shingrix Completed?: Yes  Screening Tests Health  Maintenance  Topic Date Due   Zoster Vaccines- Shingrix (2 of 2) 05/10/2022 (Originally 07/21/2019)   INFLUENZA VACCINE  04/03/2022   TETANUS/TDAP  06/11/2024   COLONOSCOPY (Pts 45-39yr Insurance coverage will need to be confirmed)  05/23/2025   Pneumonia Vaccine 75 Years old  Completed   DEXA SCAN  Completed   Hepatitis C Screening  Completed   HPV VACCINES  Aged Out   URINE MICROALBUMIN  Discontinued   COVID-19 Vaccine  Discontinued    Health Maintenance  There are no preventive care reminders to display for this patient.   Colorectal cancer screening: Type of screening: Colonoscopy. Completed 05/24/15. Repeat every 10 years  Mammogram status: No longer required due to Age.  Bone Density status: Completed 10/09/12. Results reflect: Bone density results: NORMAL. Repeat every   years.  Lung Cancer Screening: (Low Dose CT Chest recommended if Age 75-80years, 30 pack-year currently smoking OR have quit w/in 15years.) does not qualify.   Lung Cancer Screening Referral:   Additional Screening:  Hepatitis C Screening: does qualify; Completed 07/24/17  Vision Screening: Recommended annual ophthalmology exams for early detection of glaucoma and other disorders of the eye. Is the patient up to date with their annual eye exam?  Yes  Who is the provider or what is the name of the office in which the patient attends annual eye exams? Dr DBing PlumeIf pt is not established with a provider, would they like to be referred to a provider to establish care? No .   Dental Screening: Recommended annual dental exams for proper oral hygiene  Community Resource Referral / Chronic Care Management:  CRR required this visit?  No   CCM required this visit?  No      Plan:     I have personally reviewed and noted the following in the patient's chart:   Medical and social history Use of alcohol, tobacco or illicit drugs  Current medications and supplements including opioid prescriptions.   Functional ability and status Nutritional status Physical activity Advanced directives List of other physicians Hospitalizations, surgeries, and ER visits in previous 12 months Vitals Screenings to include cognitive, depression, and falls Referrals and appointments  In addition, I have reviewed and discussed with patient certain preventive protocols, quality metrics, and best practice recommendations. A written personalized care plan for preventive services as well as general preventive health recommendations were provided to patient.     BCriselda Peaches LPN   [id number]  Nurse Notes: None

## 2022-02-07 NOTE — Patient Instructions (Addendum)
Sally Shea , Thank you for taking time to come for your Medicare Wellness Visit. I appreciate your ongoing commitment to your health goals. Please review the following plan we discussed and let me know if I can assist you in the future.   These are the goals we discussed:  Goals       Patient Stated (pt-stated)      Get more exercise in.        This is a list of the screening recommended for you and due dates:  Health Maintenance  Topic Date Due   Zoster (Shingles) Vaccine (2 of 2) 05/10/2022*   Flu Shot  04/03/2022   Tetanus Vaccine  06/11/2024   Colon Cancer Screening  05/23/2025   Pneumonia Vaccine  Completed   DEXA scan (bone density measurement)  Completed   Hepatitis C Screening: USPSTF Recommendation to screen - Ages 27-79 yo.  Completed   HPV Vaccine  Aged Out   Urine Protein Check  Discontinued   COVID-19 Vaccine  Discontinued  *Topic was postponed. The date shown is not the original due date.    Advanced directives: Yes  Conditions/risks identified: None  Next appointment: Follow up in one year for your annual wellness visit     Preventive Care 65 Years and Older, Female Preventive care refers to lifestyle choices and visits with your health care provider that can promote health and wellness. What does preventive care include? A yearly physical exam. This is also called an annual well check. Dental exams once or twice a year. Routine eye exams. Ask your health care provider how often you should have your eyes checked. Personal lifestyle choices, including: Daily care of your teeth and gums. Regular physical activity. Eating a healthy diet. Avoiding tobacco and drug use. Limiting alcohol use. Practicing safe sex. Taking low-dose aspirin every day. Taking vitamin and mineral supplements as recommended by your health care provider. What happens during an annual well check? The services and screenings done by your health care provider during your annual well  check will depend on your age, overall health, lifestyle risk factors, and family history of disease. Counseling  Your health care provider may ask you questions about your: Alcohol use. Tobacco use. Drug use. Emotional well-being. Home and relationship well-being. Sexual activity. Eating habits. History of falls. Memory and ability to understand (cognition). Work and work Statistician. Reproductive health. Screening  You may have the following tests or measurements: Height, weight, and BMI. Blood pressure. Lipid and cholesterol levels. These may be checked every 5 years, or more frequently if you are over 66 years old. Skin check. Lung cancer screening. You may have this screening every year starting at age 15 if you have a 30-pack-year history of smoking and currently smoke or have quit within the past 15 years. Fecal occult blood test (FOBT) of the stool. You may have this test every year starting at age 37. Flexible sigmoidoscopy or colonoscopy. You may have a sigmoidoscopy every 5 years or a colonoscopy every 10 years starting at age 68. Hepatitis C blood test. Hepatitis B blood test. Sexually transmitted disease (STD) testing. Diabetes screening. This is done by checking your blood sugar (glucose) after you have not eaten for a while (fasting). You may have this done every 1-3 years. Bone density scan. This is done to screen for osteoporosis. You may have this done starting at age 38. Mammogram. This may be done every 1-2 years. Talk to your health care provider about how often you  should have regular mammograms. Talk with your health care provider about your test results, treatment options, and if necessary, the need for more tests. Vaccines  Your health care provider may recommend certain vaccines, such as: Influenza vaccine. This is recommended every year. Tetanus, diphtheria, and acellular pertussis (Tdap, Td) vaccine. You may need a Td booster every 10 years. Zoster  vaccine. You may need this after age 62. Pneumococcal 13-valent conjugate (PCV13) vaccine. One dose is recommended after age 74. Pneumococcal polysaccharide (PPSV23) vaccine. One dose is recommended after age 39. Talk to your health care provider about which screenings and vaccines you need and how often you need them. This information is not intended to replace advice given to you by your health care provider. Make sure you discuss any questions you have with your health care provider. Document Released: 09/16/2015 Document Revised: 05/09/2016 Document Reviewed: 06/21/2015 Elsevier Interactive Patient Education  2017 [place] Prevention in the Home Falls can cause injuries. They can happen to people of all ages. There are many things you can do to make your home safe and to help prevent falls. What can I do on the outside of my home? Regularly fix the edges of walkways and driveways and fix any cracks. Remove anything that might make you trip as you walk through a door, such as a raised step or threshold. Trim any bushes or trees on the path to your home. Use bright outdoor lighting. Clear any walking paths of anything that might make someone trip, such as rocks or tools. Regularly check to see if handrails are loose or broken. Make sure that both sides of any steps have handrails. Any raised decks and porches should have guardrails on the edges. Have any leaves, snow, or ice cleared regularly. Use sand or salt on walking paths during winter. Clean up any spills in your garage right away. This includes oil or grease spills. What can I do in the bathroom? Use night lights. Install grab bars by the toilet and in the tub and shower. Do not use towel bars as grab bars. Use non-skid mats or decals in the tub or shower. If you need to sit down in the shower, use a plastic, non-slip stool. Keep the floor dry. Clean up any water that spills on the floor as soon as it happens. Remove  soap buildup in the tub or shower regularly. Attach bath mats securely with double-sided non-slip rug tape. Do not have throw rugs and other things on the floor that can make you trip. What can I do in the bedroom? Use night lights. Make sure that you have a light by your bed that is easy to reach. Do not use any sheets or blankets that are too big for your bed. They should not hang down onto the floor. Have a firm chair that has side arms. You can use this for support while you get dressed. Do not have throw rugs and other things on the floor that can make you trip. What can I do in the kitchen? Clean up any spills right away. Avoid walking on wet floors. Keep items that you use a lot in easy-to-reach places. If you need to reach something above you, use a strong step stool that has a grab bar. Keep electrical cords out of the way. Do not use floor polish or wax that makes floors slippery. If you must use wax, use non-skid floor wax. Do not have throw rugs and other things on the  floor that can make you trip. What can I do with my stairs? Do not leave any items on the stairs. Make sure that there are handrails on both sides of the stairs and use them. Fix handrails that are broken or loose. Make sure that handrails are as long as the stairways. Check any carpeting to make sure that it is firmly attached to the stairs. Fix any carpet that is loose or worn. Avoid having throw rugs at the top or bottom of the stairs. If you do have throw rugs, attach them to the floor with carpet tape. Make sure that you have a light switch at the top of the stairs and the bottom of the stairs. If you do not have them, ask someone to add them for you. What else can I do to help prevent falls? Wear shoes that: Do not have high heels. Have rubber bottoms. Are comfortable and fit you well. Are closed at the toe. Do not wear sandals. If you use a stepladder: Make sure that it is fully opened. Do not climb a  closed stepladder. Make sure that both sides of the stepladder are locked into place. Ask someone to hold it for you, if possible. Clearly mark and make sure that you can see: Any grab bars or handrails. First and last steps. Where the edge of each step is. Use tools that help you move around (mobility aids) if they are needed. These include: Canes. Walkers. Scooters. Crutches. Turn on the lights when you go into a dark area. Replace any light bulbs as soon as they burn out. Set up your furniture so you have a clear path. Avoid moving your furniture around. If any of your floors are uneven, fix them. If there are any pets around you, be aware of where they are. Review your medicines with your doctor. Some medicines can make you feel dizzy. This can increase your chance of falling. Ask your doctor what other things that you can do to help prevent falls. This information is not intended to replace advice given to you by your health care provider. Make sure you discuss any questions you have with your health care provider. Document Released: 06/16/2009 Document Revised: 01/26/2016 Document Reviewed: 09/24/2014 Elsevier Interactive Patient Education  2017 [employer].

## 2022-02-23 DIAGNOSIS — Z4689 Encounter for fitting and adjustment of other specified devices: Secondary | ICD-10-CM | POA: Diagnosis not present

## 2022-02-23 DIAGNOSIS — N8111 Cystocele, midline: Secondary | ICD-10-CM | POA: Diagnosis not present

## 2022-04-20 ENCOUNTER — Other Ambulatory Visit: Payer: Self-pay | Admitting: Adult Health

## 2022-04-20 DIAGNOSIS — Z1231 Encounter for screening mammogram for malignant neoplasm of breast: Secondary | ICD-10-CM

## 2022-04-29 ENCOUNTER — Other Ambulatory Visit: Payer: Self-pay | Admitting: Adult Health

## 2022-04-29 DIAGNOSIS — I10 Essential (primary) hypertension: Secondary | ICD-10-CM

## 2022-05-10 ENCOUNTER — Ambulatory Visit: Payer: Medicare PPO

## 2022-05-14 DIAGNOSIS — D485 Neoplasm of uncertain behavior of skin: Secondary | ICD-10-CM | POA: Diagnosis not present

## 2022-05-14 DIAGNOSIS — Z85828 Personal history of other malignant neoplasm of skin: Secondary | ICD-10-CM | POA: Diagnosis not present

## 2022-05-14 DIAGNOSIS — D1801 Hemangioma of skin and subcutaneous tissue: Secondary | ICD-10-CM | POA: Diagnosis not present

## 2022-05-14 DIAGNOSIS — D225 Melanocytic nevi of trunk: Secondary | ICD-10-CM | POA: Diagnosis not present

## 2022-05-14 DIAGNOSIS — D2271 Melanocytic nevi of right lower limb, including hip: Secondary | ICD-10-CM | POA: Diagnosis not present

## 2022-05-14 DIAGNOSIS — L821 Other seborrheic keratosis: Secondary | ICD-10-CM | POA: Diagnosis not present

## 2022-05-14 DIAGNOSIS — L814 Other melanin hyperpigmentation: Secondary | ICD-10-CM | POA: Diagnosis not present

## 2022-05-15 ENCOUNTER — Ambulatory Visit
Admission: RE | Admit: 2022-05-15 | Discharge: 2022-05-15 | Disposition: A | Discharge status: Home / Self Care | Payer: Medicare PPO | Source: Ambulatory Visit | Attending: Adult Health | Admitting: Adult Health

## 2022-05-15 DIAGNOSIS — Z1231 Encounter for screening mammogram for malignant neoplasm of breast: Secondary | ICD-10-CM

## 2022-05-29 DIAGNOSIS — N8111 Cystocele, midline: Secondary | ICD-10-CM | POA: Diagnosis not present

## 2022-05-29 DIAGNOSIS — Z4689 Encounter for fitting and adjustment of other specified devices: Secondary | ICD-10-CM | POA: Diagnosis not present

## 2022-06-21 ENCOUNTER — Other Ambulatory Visit: Payer: Self-pay | Admitting: Gastroenterology

## 2022-06-21 ENCOUNTER — Other Ambulatory Visit: Payer: Self-pay | Admitting: Adult Health

## 2022-06-21 DIAGNOSIS — F329 Major depressive disorder, single episode, unspecified: Secondary | ICD-10-CM

## 2022-07-31 ENCOUNTER — Encounter: Payer: Medicare PPO | Admitting: Adult Health

## 2022-07-31 ENCOUNTER — Ambulatory Visit (INDEPENDENT_AMBULATORY_CARE_PROVIDER_SITE_OTHER): Payer: Medicare PPO | Admitting: Adult Health

## 2022-07-31 ENCOUNTER — Encounter: Payer: Self-pay | Admitting: Adult Health

## 2022-07-31 VITALS — BP 110/70 | HR 76 | Temp 97.9°F | Ht 59.0 in | Wt 121.0 lb

## 2022-07-31 DIAGNOSIS — F419 Anxiety disorder, unspecified: Secondary | ICD-10-CM

## 2022-07-31 DIAGNOSIS — R7303 Prediabetes: Secondary | ICD-10-CM

## 2022-07-31 DIAGNOSIS — Z0000 Encounter for general adult medical examination without abnormal findings: Secondary | ICD-10-CM

## 2022-07-31 DIAGNOSIS — Z23 Encounter for immunization: Secondary | ICD-10-CM | POA: Diagnosis not present

## 2022-07-31 DIAGNOSIS — K219 Gastro-esophageal reflux disease without esophagitis: Secondary | ICD-10-CM | POA: Diagnosis not present

## 2022-07-31 DIAGNOSIS — F32A Depression, unspecified: Secondary | ICD-10-CM

## 2022-07-31 DIAGNOSIS — I10 Essential (primary) hypertension: Secondary | ICD-10-CM | POA: Diagnosis not present

## 2022-07-31 DIAGNOSIS — E782 Mixed hyperlipidemia: Secondary | ICD-10-CM

## 2022-07-31 LAB — COMPREHENSIVE METABOLIC PANEL
ALT: 16 U/L (ref 0–35)
AST: 20 U/L (ref 0–37)
Albumin: 4.4 g/dL (ref 3.5–5.2)
Alkaline Phosphatase: 65 U/L (ref 39–117)
BUN: 18 mg/dL (ref 6–23)
CO2: 30 mEq/L (ref 19–32)
Calcium: 9.5 mg/dL (ref 8.4–10.5)
Chloride: 100 mEq/L (ref 96–112)
Creatinine, Ser: 0.77 mg/dL (ref 0.40–1.20)
GFR: 75.34 mL/min (ref >60.00)
Glucose, Bld: 87 mg/dL (ref 70–99)
Potassium: 3.9 mEq/L (ref 3.5–5.1)
Sodium: 138 mEq/L (ref 135–145)
Total Bilirubin: 0.6 mg/dL (ref 0.2–1.2)
Total Protein: 7.3 g/dL (ref 6.0–8.3)

## 2022-07-31 LAB — CBC WITH DIFFERENTIAL/PLATELET
Basophils Absolute: 0.1 10*3/uL (ref 0.0–0.1)
Basophils Relative: 1.2 % (ref 0.0–3.0)
Eosinophils Absolute: 0.1 10*3/uL (ref 0.0–0.7)
Eosinophils Relative: 2 % (ref 0.0–5.0)
HCT: 40.1 % (ref 36.0–46.0)
Hemoglobin: 13.7 g/dL (ref 12.0–15.0)
Lymphocytes Relative: 38.6 % (ref 12.0–46.0)
Lymphs Abs: 2.2 10*3/uL (ref 0.7–4.0)
MCHC: 34.1 g/dL (ref 30.0–36.0)
MCV: 90.7 fl (ref 78.0–100.0)
Monocytes Absolute: 0.5 10*3/uL (ref 0.1–1.0)
Monocytes Relative: 8.4 % (ref 3.0–12.0)
Neutro Abs: 2.9 10*3/uL (ref 1.4–7.7)
Neutrophils Relative %: 49.8 % (ref 43.0–77.0)
Platelets: 385 10*3/uL (ref 150.0–400.0)
RBC: 4.42 Mil/uL (ref 3.87–5.11)
RDW: 13.1 % (ref 11.5–15.5)
WBC: 5.7 10*3/uL (ref 4.0–10.5)

## 2022-07-31 LAB — LIPID PANEL
Cholesterol: 237 mg/dL — ABNORMAL HIGH (ref 0–200)
HDL: 58.2 mg/dL (ref >39.00)
LDL Cholesterol: 158 mg/dL — ABNORMAL HIGH (ref 0–99)
NonHDL: 178.88
Total CHOL/HDL Ratio: 4
Triglycerides: 105 mg/dL (ref 0.0–149.0)
VLDL: 21 mg/dL (ref 0.0–40.0)

## 2022-07-31 LAB — HEMOGLOBIN A1C: Hgb A1c MFr Bld: 6 % (ref 4.6–6.5)

## 2022-07-31 LAB — TSH: TSH: 1.91 u[IU]/mL (ref 0.35–5.50)

## 2022-07-31 NOTE — Progress Notes (Signed)
Subjective:    Patient ID: Sally Shea, female    DOB: 05-Jun-1946, 76 y.o.   MRN: [id number]  HPI Patient presents for yearly preventative medicine examination. She is a pleasant 76 year old female who  has a past medical history of Allergy, Anemia, Anxiety, Blood transfusion without reported diagnosis, Degenerative cervical disc, Depression, GERD (gastroesophageal reflux disease), Headache(784.0), and Hypertension.  Hypertension-managed with Dyazide 37.5-25 mg daily controlled on this medication.  She denies dizziness, lightheadedness, chest pain, shortness of breath. BP Readings from Last 3 Encounters:  07/31/22 110/70  11/07/21 130/90  07/25/21 110/80   Anxiety/depression-well managed with Zoloft 50 mg daily  GERD-currently on Protonix 40 mg daily.  She does feel well controlled on this medication. She will use Carafate every so often for flares.   Hyperlipidemia-managed with Crestor 20 mg weekly.  She denies myalgia or fatigue when she takes this weekly. Lab Results  Component Value Date   CHOL 183 07/25/2021   HDL 57.30 07/25/2021   LDLCALC 103 (H) 07/25/2021   LDLDIRECT 176.0 06/13/2011   TRIG 113.0 07/25/2021   CHOLHDL 3 07/25/2021   Pre Diabetes - not currently on medication.  Lab Results  Component Value Date   HGBA1C 6.0 07/25/2021    All immunizations and health maintenance protocols were reviewed with the patient and needed orders were placed.  Appropriate screening laboratory values were ordered for the patient including screening of hyperlipidemia, renal function and hepatic function.   Medication reconciliation,  past medical history, social history, problem list and allergies were reviewed in detail with the patient  Goals were established with regard to weight loss, exercise, and  diet in compliance with medications Wt Readings from Last 3 Encounters:  07/31/22 121 lb (54.9 kg)  02/07/22 123 lb (55.8 kg)  11/07/21 123 lb (55.8 kg)   Review of Systems   Constitutional: Negative.   HENT: Negative.    Eyes: Negative.   Respiratory: Negative.    Cardiovascular: Negative.   Gastrointestinal: Negative.   Endocrine: Negative.   Genitourinary: Negative.   Musculoskeletal: Negative.   Skin: Negative.   Allergic/Immunologic: Negative.   Neurological: Negative.   Hematological: Negative.   Psychiatric/Behavioral: Negative.     Past Medical History:  Diagnosis Date   Allergy    seasonal   Anemia    Anxiety    Blood transfusion without reported diagnosis    years ago   Degenerative cervical disc    Depression    GERD (gastroesophageal reflux disease)    Headache(784.0)    Hypertension     Social History   Socioeconomic History   Marital status: Married    Spouse name: Not on file   Number of children: Not on file   Years of education: Not on file   Highest education level: Bachelor's degree (e.g., BA, AB, BS)  Occupational History   Not on file  Tobacco Use   Smoking status: Never   Smokeless tobacco: Never  Vaping Use   Vaping Use: Never used  Substance and Sexual Activity   Alcohol use: No   Drug use: No   Sexual activity: Not on file  Other Topics Concern   Not on file  Social History Narrative   Not on file   Social Determinants of Health   Financial Resource Strain: Low Risk  (02/07/2022)   Overall Financial Resource Strain (CARDIA)    Difficulty of Paying Living Expenses: Not hard at all  Food Insecurity: No Food Insecurity (02/07/2022)  Hunger Vital Sign    Worried About Running Out of Food in the Last Year: Never true    Ran Out of Food in the Last Year: Never true  Transportation Needs: No Transportation Needs (02/07/2022)   PRAPARE - Hydrologist (Medical): No    Lack of Transportation (Non-Medical): No  Physical Activity: Sufficiently Active (02/07/2022)   Exercise Vital Sign    Days of Exercise per Week: 7 days    Minutes of Exercise per Session: 50 min  Stress: No Stress  Concern Present (02/07/2022)   [place]    Feeling of Stress : Not at all  Social Connections: Socially Integrated (02/07/2022)   Social Connection and Isolation Panel [NHANES]    Frequency of Communication with Friends and Family: More than three times a week    Frequency of Social Gatherings with Friends and Family: More than three times a week    Attends Religious Services: More than 4 times per year    Active Member of [employer] or Organizations: Yes    Attends Archivist Meetings: 1 to 4 times per year    Marital Status: Married  Human resources officer Violence: Not At Risk (02/07/2022)   Humiliation, Afraid, Rape, and Kick questionnaire    Fear of Current or Ex-Partner: No    Emotionally Abused: No    Physically Abused: No    Sexually Abused: No    Past Surgical History:  Procedure Laterality Date   COLONOSCOPY     EYE SURGERY     TUBAL LIGATION      Family History  Problem Relation Age of Onset   Heart disease Mother    Alcohol abuse Other    Anxiety disorder Other    Depression Other    Hyperlipidemia Other    Hypertension Other    Colon cancer Neg Hx    Esophageal cancer Neg Hx    Stomach cancer Neg Hx    Rectal cancer Neg Hx    Breast cancer Neg Hx     No Active Allergies  Current Outpatient Medications on File Prior to Visit  Medication Sig Dispense Refill   alprazolam (XANAX) 2 MG tablet Take by mouth.     aspirin 81 MG tablet Take 81 mg by mouth daily.     calcium citrate-vitamin D (CITRACAL+D) 315-200 MG-UNIT per tablet Take 1 tablet by mouth daily.     conjugated estrogens (PREMARIN) vaginal cream Place 0.5 Applicatorfuls vaginally daily. 42.5 g 11   fish oil-omega-3 fatty acids 1000 MG capsule Take 1 g by mouth daily.     pantoprazole (PROTONIX) 40 MG tablet Take 1 tablet (40 mg total) by mouth daily. Please schedule a yearly office visit with Dr. Havery Moros for further refills. Thank you  90 tablet 0   rosuvastatin (CRESTOR) 20 MG tablet Take 20 mg by mouth once a week.     sertraline (ZOLOFT) 50 MG tablet TAKE 1 TABLET BY MOUTH EVERY DAY 90 tablet 1   sucralfate (CARAFATE) 1 GM/10ML suspension TAKE 10 MLS (1 G TOTAL) BY MOUTH EVERY 6 (SIX) HOURS AS NEEDED. 240 mL 3   triamterene-hydrochlorothiazide (DYAZIDE) 37.5-25 MG capsule TAKE 1 EACH (1 CAPSULE TOTAL) BY MOUTH DAILY. 90 capsule 1   [DISCONTINUED] pantoprazole (PROTONIX) 40 MG tablet Take 1 tablet (40 mg total) by mouth daily. Please keep your September appointment for further refills. Thank you 30 tablet 1   No current facility-administered medications  on file prior to visit.    BP 110/70   Pulse 76   Temp 97.9 F (36.6 C) (Oral)   Ht '4\' 11"'$  (1.499 m)   Wt 121 lb (54.9 kg)   SpO2 94%   BMI 24.44 kg/m       Objective:   Physical Exam Vitals and nursing note reviewed.  Constitutional:      General: She is not in acute distress.    Appearance: Normal appearance. She is well-developed. She is not ill-appearing.  HENT:     Head: Normocephalic and atraumatic.     Right Ear: Tympanic membrane, ear canal and external ear normal. There is no impacted cerumen.     Left Ear: Tympanic membrane, ear canal and external ear normal. There is no impacted cerumen.     Nose: Nose normal. No congestion or rhinorrhea.     Mouth/Throat:     Mouth: Mucous membranes are moist.     Pharynx: Oropharynx is clear. No oropharyngeal exudate or posterior oropharyngeal erythema.  Eyes:     General:        Right eye: No discharge.        Left eye: No discharge.     Extraocular Movements: Extraocular movements intact.     Conjunctiva/sclera: Conjunctivae normal.     Pupils: Pupils are equal, round, and reactive to light.  Neck:     Thyroid: No thyromegaly.     Vascular: No carotid bruit.     Trachea: No tracheal deviation.  Cardiovascular:     Rate and Rhythm: Normal rate and regular rhythm.     Pulses: Normal pulses.     Heart  sounds: Normal heart sounds. No murmur heard.    No friction rub. No gallop.  Pulmonary:     Effort: Pulmonary effort is normal. No respiratory distress.     Breath sounds: Normal breath sounds. No stridor. No wheezing, rhonchi or rales.  Chest:     Chest wall: No tenderness.  Abdominal:     General: Abdomen is flat. Bowel sounds are normal. There is no distension.     Palpations: Abdomen is soft. There is no mass.     Tenderness: There is no abdominal tenderness. There is no right CVA tenderness, left CVA tenderness, guarding or rebound.     Hernia: No hernia is present.  Musculoskeletal:        General: No swelling, tenderness, deformity or signs of injury. Normal range of motion.     Cervical back: Normal range of motion and neck supple.     Right lower leg: No edema.     Left lower leg: No edema.  Lymphadenopathy:     Cervical: No cervical adenopathy.  Skin:    General: Skin is warm and dry.     Coloration: Skin is not jaundiced or pale.     Findings: No bruising, erythema, lesion or rash.  Neurological:     General: No focal deficit present.     Mental Status: She is alert and oriented to person, place, and time.     Cranial Nerves: No cranial nerve deficit.     Sensory: No sensory deficit.     Motor: No weakness.     Coordination: Coordination normal.     Gait: Gait normal.     Deep Tendon Reflexes: Reflexes normal.  Psychiatric:        Mood and Affect: Mood normal.        Behavior: Behavior normal.  Thought Content: Thought content normal.        Judgment: Judgment normal.       Assessment & Plan:  1. Routine general medical examination at a health care facility - Continue to stay active and eat healthy  - Follow up in one year or sooner if needed - CBC with Differential/Platelet; Future - Comprehensive metabolic panel; Future - Hemoglobin A1c; Future - Lipid panel; Future - TSH; Future  2. Essential hypertension - Well controlled.  - No change in  medications  - CBC with Differential/Platelet; Future - Comprehensive metabolic panel; Future - Hemoglobin A1c; Future - Lipid panel; Future - TSH; Future  3. Mixed hyperlipidemia - Consider increase in statin  - CBC with Differential/Platelet; Future - Comprehensive metabolic panel; Future - Hemoglobin A1c; Future - Lipid panel; Future - TSH; Future  4. Anxiety and depression - Continue Zoloft  - CBC with Differential/Platelet; Future - Comprehensive metabolic panel; Future - Hemoglobin A1c; Future - Lipid panel; Future - TSH; Future  5. Gastroesophageal reflux disease without esophagitis - Continue PPI daily and carafate as needed - CBC with Differential/Platelet; Future - Comprehensive metabolic panel; Future - Hemoglobin A1c; Future - Lipid panel; Future - TSH; Future  6. Pre-diabetes - Consider metformin  - CBC with Differential/Platelet; Future - Comprehensive metabolic panel; Future - Hemoglobin A1c; Future - Lipid panel; Future - TSH; Future  7. Need for immunization against influenza  - Flu Vaccine QUAD High Dose(Fluad)  Dorothyann Peng, NP

## 2022-08-01 ENCOUNTER — Telehealth: Payer: Self-pay | Admitting: Adult Health

## 2022-08-01 NOTE — Telephone Encounter (Signed)
Updated patient on her labs. Her cholesterol panel has increased. She reports that she has not been diligent on taking her crestor weekly but will get back to taking it

## 2022-09-04 ENCOUNTER — Emergency Department (HOSPITAL_BASED_OUTPATIENT_CLINIC_OR_DEPARTMENT_OTHER)
Admission: EM | Admit: 2022-09-04 | Discharge: 2022-09-04 | Discharge status: Against Medical Advice | Payer: Medicare PPO | Attending: Emergency Medicine | Admitting: Emergency Medicine

## 2022-09-04 ENCOUNTER — Other Ambulatory Visit: Payer: Self-pay

## 2022-09-04 ENCOUNTER — Encounter (HOSPITAL_BASED_OUTPATIENT_CLINIC_OR_DEPARTMENT_OTHER): Payer: Self-pay | Admitting: Emergency Medicine

## 2022-09-04 ENCOUNTER — Encounter: Payer: Self-pay | Admitting: Gastroenterology

## 2022-09-04 ENCOUNTER — Emergency Department (HOSPITAL_BASED_OUTPATIENT_CLINIC_OR_DEPARTMENT_OTHER): Payer: Medicare PPO | Admitting: Radiology

## 2022-09-04 DIAGNOSIS — R058 Other specified cough: Secondary | ICD-10-CM | POA: Diagnosis not present

## 2022-09-04 DIAGNOSIS — Z1152 Encounter for screening for COVID-19: Secondary | ICD-10-CM | POA: Insufficient documentation

## 2022-09-04 DIAGNOSIS — R519 Headache, unspecified: Secondary | ICD-10-CM | POA: Insufficient documentation

## 2022-09-04 DIAGNOSIS — R059 Cough, unspecified: Secondary | ICD-10-CM | POA: Diagnosis not present

## 2022-09-04 DIAGNOSIS — Z5321 Procedure and treatment not carried out due to patient leaving prior to being seen by health care provider: Secondary | ICD-10-CM | POA: Diagnosis not present

## 2022-09-04 LAB — RESP PANEL BY RT-PCR (RSV, FLU A&B, COVID)  RVPGX2
Influenza A by PCR: NEGATIVE
Influenza B by PCR: NEGATIVE
Resp Syncytial Virus by PCR: NEGATIVE
SARS Coronavirus 2 by RT PCR: NEGATIVE

## 2022-09-04 NOTE — ED Triage Notes (Signed)
Cough x 1 week. Also endorses headache.

## 2022-09-04 NOTE — ED Notes (Signed)
Pt stated she was going to get her xray result from her PCP and that she is leaving.

## 2022-09-06 ENCOUNTER — Telehealth: Payer: Self-pay

## 2022-09-06 NOTE — Telephone Encounter (Signed)
     Patient  visit on 1/2  at [facility] you been able to follow up with your primary care physician? No   The patient was or was not able to obtain any needed medicine or equipment. Yes   Are there diet recommendations that you are having difficulty following? Na   Patient expresses understanding of discharge instructions and education provided has no other needs at this time.  Yes     [place], [facility], Care Management  [phone] [address], [place], [place] [address] Phone: [phone] Email: [email or web address]

## 2022-09-12 DIAGNOSIS — N8111 Cystocele, midline: Secondary | ICD-10-CM | POA: Diagnosis not present

## 2022-09-12 DIAGNOSIS — Z4689 Encounter for fitting and adjustment of other specified devices: Secondary | ICD-10-CM | POA: Diagnosis not present

## 2022-09-13 ENCOUNTER — Other Ambulatory Visit: Payer: Self-pay | Admitting: Gastroenterology

## 2022-09-14 ENCOUNTER — Ambulatory Visit: Payer: Medicare PPO | Admitting: Adult Health

## 2022-09-14 VITALS — BP 120/80 | HR 85 | Temp 98.3°F | Ht 61.0 in | Wt 121.0 lb

## 2022-09-14 DIAGNOSIS — G8929 Other chronic pain: Secondary | ICD-10-CM | POA: Diagnosis not present

## 2022-09-14 DIAGNOSIS — M25511 Pain in right shoulder: Secondary | ICD-10-CM | POA: Diagnosis not present

## 2022-09-14 MED ORDER — METHYLPREDNISOLONE ACETATE 80 MG/ML IJ SUSP
80.0000 mg | Freq: Once | INTRAMUSCULAR | Status: AC
  Administered 2022-09-14: 80 mg via INTRA_ARTICULAR

## 2022-09-14 NOTE — Progress Notes (Signed)
Subjective:    Patient ID: Sally Shea, female    DOB: 11-28-1945, 77 y.o.   MRN: [id number]  Shoulder Pain    77 year old female who  has a past medical history of Allergy, Anemia, Anxiety, Blood transfusion without reported diagnosis, Degenerative cervical disc, Depression, GERD (gastroesophageal reflux disease), Headache(784.0), and Hypertension.  She presents to the office today for chronic right shoulder pain. In the past she has responded well to steroid injections. Her last injection was in 2021. She reports that the pain has been present for about 6 months. She has pain with ROM but has full ROM.   She would like to have another steroid injection   Review of Systems See HPI   Past Medical History:  Diagnosis Date   Allergy    seasonal   Anemia    Anxiety    Blood transfusion without reported diagnosis    years ago   Degenerative cervical disc    Depression    GERD (gastroesophageal reflux disease)    Headache(784.0)    Hypertension     Social History   Socioeconomic History   Marital status: Married    Spouse name: Not on file   Number of children: Not on file   Years of education: Not on file   Highest education level: Bachelor's degree (e.g., BA, AB, BS)  Occupational History   Not on file  Tobacco Use   Smoking status: Never   Smokeless tobacco: Never  Vaping Use   Vaping Use: Never used  Substance and Sexual Activity   Alcohol use: No   Drug use: No   Sexual activity: Not on file  Other Topics Concern   Not on file  Social History Narrative   Not on file   Social Determinants of Health   Financial Resource Strain: Low Risk  (02/07/2022)   Overall Financial Resource Strain (CARDIA)    Difficulty of Paying Living Expenses: Not hard at all  Food Insecurity: No Food Insecurity (02/07/2022)   Hunger Vital Sign    Worried About Running Out of Food in the Last Year: Never true    Ran Out of Food in the Last Year: Never true  Transportation Needs: No  Transportation Needs (02/07/2022)   PRAPARE - Hydrologist (Medical): No    Lack of Transportation (Non-Medical): No  Physical Activity: Sufficiently Active (02/07/2022)   Exercise Vital Sign    Days of Exercise per Week: 7 days    Minutes of Exercise per Session: 50 min  Stress: No Stress Concern Present (02/07/2022)   Sally Shea    Feeling of Stress : Not at all  Social Connections: [place] (02/07/2022)   Social Connection and Isolation Panel [NHANES]    Frequency of Communication with Friends and Family: More than three times a week    Frequency of Social Gatherings with Friends and Family: More than three times a week    Attends Religious Services: More than 4 times per year    Active Member of [employer] or Organizations: Yes    Attends Archivist Meetings: 1 to 4 times per year    Marital Status: Married  Human resources officer Violence: Not At Risk (02/07/2022)   Humiliation, Afraid, Rape, and Kick questionnaire    Fear of Current or Ex-Partner: No    Emotionally Abused: No    Physically Abused: No    Sexually Abused: No  Past Surgical History:  Procedure Laterality Date   COLONOSCOPY     EYE SURGERY     TUBAL LIGATION      Family History  Problem Relation Age of Onset   Heart disease Mother    Alcohol abuse Other    Anxiety disorder Other    Depression Other    Hyperlipidemia Other    Hypertension Other    Colon cancer Neg Hx    Esophageal cancer Neg Hx    Stomach cancer Neg Hx    Rectal cancer Neg Hx    Breast cancer Neg Hx     No Known Allergies  Current Outpatient Medications on File Prior to Visit  Medication Sig Dispense Refill   alprazolam (XANAX) 2 MG tablet Take by mouth.     aspirin 81 MG tablet Take 81 mg by mouth daily.     calcium citrate-vitamin D (CITRACAL+D) 315-200 MG-UNIT per tablet Take 1 tablet by mouth daily.     conjugated estrogens  (PREMARIN) vaginal cream Place 0.5 Applicatorfuls vaginally daily. 42.5 g 11   fish oil-omega-3 fatty acids 1000 MG capsule Take 1 g by mouth daily.     pantoprazole (PROTONIX) 40 MG tablet TAKE 1 TABLET (40 MG TOTAL) BY MOUTH DAILY. PLEASE SCHEDULE A YEARLY OFFICE VISIT WITH DR. Havery Moros FOR FURTHER REFILLS. THANK YOU 30 tablet 0   rosuvastatin (CRESTOR) 20 MG tablet Take 20 mg by mouth once a week.     sertraline (ZOLOFT) 50 MG tablet TAKE 1 TABLET BY MOUTH EVERY DAY 90 tablet 1   sucralfate (CARAFATE) 1 GM/10ML suspension TAKE 10 MLS (1 G TOTAL) BY MOUTH EVERY 6 (SIX) HOURS AS NEEDED. 240 mL 3   triamterene-hydrochlorothiazide (DYAZIDE) 37.5-25 MG capsule TAKE 1 EACH (1 CAPSULE TOTAL) BY MOUTH DAILY. 90 capsule 1   [DISCONTINUED] pantoprazole (PROTONIX) 40 MG tablet Take 1 tablet (40 mg total) by mouth daily. Please keep your September appointment for further refills. Thank you 30 tablet 1   No current facility-administered medications on file prior to visit.    BP 120/80   Pulse 85   Temp 98.3 F (36.8 C) (Oral)   Ht '5\' 1"'$  (1.549 m)   Wt 121 lb (54.9 kg)   SpO2 95%   BMI 22.86 kg/m       Objective:   Physical Exam Vitals and nursing note reviewed.  Constitutional:      Appearance: Normal appearance.  Cardiovascular:     Rate and Rhythm: Regular rhythm.  Musculoskeletal:        General: Normal range of motion.  Skin:    General: Skin is warm and dry.     Capillary Refill: Capillary refill takes less than 2 seconds.  Neurological:     General: No focal deficit present.     Mental Status: She is alert and oriented to person, place, and time.  Psychiatric:        Mood and Affect: Mood normal.        Behavior: Behavior normal.        Thought Content: Thought content normal.        Judgment: Judgment normal.       Assessment & Plan:  1. Chronic right shoulder pain Shoulder injection Verbal consent obtained and verified. Sterile betadine prep. Furthur cleansed with  alcohol. Topical analgesic spray: Ethyl chloride. Joint: right subacromial injection Approached in typical fashion with: posterior approach Completed without difficulty Meds: 3 cc lidocaine 2% no epi, 1 cc depomedrol '80mg'$ /cc Needle:1.5  inch 25 gauge Aftercare instructions and Red flags advised. Immediate improvement in pain noted  - methylPREDNISolone acetate (DEPO-MEDROL) injection 80 mg  Dorothyann Peng, NP

## 2022-10-02 DIAGNOSIS — N8111 Cystocele, midline: Secondary | ICD-10-CM | POA: Diagnosis not present

## 2022-10-02 DIAGNOSIS — N3941 Urge incontinence: Secondary | ICD-10-CM | POA: Diagnosis not present

## 2022-10-02 DIAGNOSIS — R278 Other lack of coordination: Secondary | ICD-10-CM | POA: Diagnosis not present

## 2022-10-02 DIAGNOSIS — M6281 Muscle weakness (generalized): Secondary | ICD-10-CM | POA: Diagnosis not present

## 2022-10-11 DIAGNOSIS — N3941 Urge incontinence: Secondary | ICD-10-CM | POA: Diagnosis not present

## 2022-10-11 DIAGNOSIS — R278 Other lack of coordination: Secondary | ICD-10-CM | POA: Diagnosis not present

## 2022-10-11 DIAGNOSIS — M6281 Muscle weakness (generalized): Secondary | ICD-10-CM | POA: Diagnosis not present

## 2022-10-11 DIAGNOSIS — N8111 Cystocele, midline: Secondary | ICD-10-CM | POA: Diagnosis not present

## 2022-10-12 ENCOUNTER — Other Ambulatory Visit: Payer: Self-pay | Admitting: Gastroenterology

## 2022-10-24 DIAGNOSIS — R278 Other lack of coordination: Secondary | ICD-10-CM | POA: Diagnosis not present

## 2022-10-24 DIAGNOSIS — N8111 Cystocele, midline: Secondary | ICD-10-CM | POA: Diagnosis not present

## 2022-10-24 DIAGNOSIS — M6281 Muscle weakness (generalized): Secondary | ICD-10-CM | POA: Diagnosis not present

## 2022-10-24 DIAGNOSIS — N3941 Urge incontinence: Secondary | ICD-10-CM | POA: Diagnosis not present

## 2022-10-30 DIAGNOSIS — R278 Other lack of coordination: Secondary | ICD-10-CM | POA: Diagnosis not present

## 2022-10-30 DIAGNOSIS — M6281 Muscle weakness (generalized): Secondary | ICD-10-CM | POA: Diagnosis not present

## 2022-10-30 DIAGNOSIS — N3941 Urge incontinence: Secondary | ICD-10-CM | POA: Diagnosis not present

## 2022-10-30 DIAGNOSIS — N8111 Cystocele, midline: Secondary | ICD-10-CM | POA: Diagnosis not present

## 2022-11-03 ENCOUNTER — Other Ambulatory Visit: Payer: Self-pay | Admitting: Adult Health

## 2022-11-03 DIAGNOSIS — I10 Essential (primary) hypertension: Secondary | ICD-10-CM

## 2022-11-07 DIAGNOSIS — N3941 Urge incontinence: Secondary | ICD-10-CM | POA: Diagnosis not present

## 2022-11-07 DIAGNOSIS — R278 Other lack of coordination: Secondary | ICD-10-CM | POA: Diagnosis not present

## 2022-11-07 DIAGNOSIS — M6281 Muscle weakness (generalized): Secondary | ICD-10-CM | POA: Diagnosis not present

## 2022-11-07 DIAGNOSIS — N8111 Cystocele, midline: Secondary | ICD-10-CM | POA: Diagnosis not present

## 2022-11-14 ENCOUNTER — Encounter: Payer: Self-pay | Admitting: Gastroenterology

## 2022-11-14 ENCOUNTER — Ambulatory Visit: Payer: Medicare PPO | Admitting: Gastroenterology

## 2022-11-14 VITALS — BP 120/70 | HR 88 | Ht 61.0 in | Wt 122.2 lb

## 2022-11-14 DIAGNOSIS — K449 Diaphragmatic hernia without obstruction or gangrene: Secondary | ICD-10-CM

## 2022-11-14 DIAGNOSIS — K219 Gastro-esophageal reflux disease without esophagitis: Secondary | ICD-10-CM | POA: Diagnosis not present

## 2022-11-14 DIAGNOSIS — Z79899 Other long term (current) drug therapy: Secondary | ICD-10-CM | POA: Diagnosis not present

## 2022-11-14 MED ORDER — PANTOPRAZOLE SODIUM 40 MG PO TBEC: DELAYED_RELEASE_TABLET | ORAL | Status: DC

## 2022-11-14 NOTE — Progress Notes (Signed)
HPI :  77 y/o female here for a follow up visit for GERD and hiatal hernia.  Recall she had an EGD with me for reflux and dysphagia in November 2020.  She had a 5 cm hiatal hernia with distal esophageal ulcer/esophagitis associated with a peptic stricture that was dilated to 15.5 mm.  We started Protonix 40 mg daily at that time.  She followed up for repeat endoscopy in October 2022 which showed interval healing of esophagitis, slightly irregular Z-line, biopsies were negative for Barrett's.  She again had GEJ stenosis dilated with a balloon to 15.5 mm which resolved dysphagia at that time.  I have not seen her since then.  Generally has been doing really well since have last seen her.  She has had no dysphagia since the last dilation.  She reports she continues to take Protonix 40 mg once daily.  For the most part that works pretty well to control her reflux symptoms.  She will rarely have some breakthrough where she will need to use some liquid Carafate which works quite well to control it.  On average she takes Carafate perhaps once every 2 to 3 months, this is quite seldom.  She eats well, no abdominal pain, no nausea or vomiting.  She has no history of kidney disease or osteoporosis/pia, she inquired about long-term risks of the regimen and options moving forward.  Otherwise if she avoids her trigger foods her symptoms are quite minimal and she has been watching her diet very carefully.  She has not wanted to have a hiatal hernia repair.  Prior workup:  EGD 07/14/2019 -  - A 5 cm hiatal hernia was present. - One benign-appearing, intrinsic moderate stenosis was found 30 cm from the incisors. This stenosis measured less than one cm (in length). The stenosis was traversed. A TTS dilator was passed through the scope. Dilation with a 13.5-14.5-15.5 mm balloon dilator was performed to 13.5 mm, 14.5 mm and 15.5 mm after which multiple appropriate mucosal wrents were noted. There was slight  nodularity noted at the 6 o clock position at the GEJ which appeared benign / inflammatory. Biopsies were taken with a cold forceps for histology. - Esophagitis was found at the GEJ with ulceration. - The exam of the esophagus was otherwise normal. - The entire examined stomach was normal. - The duodenal bulb and second portion of the duodenum were normal.    Surgical [P], esophagus, GE junction - ESOPHAGEAL SQUAMOUS AND CARDIAC MUCOSA WITH NO SPECIFIC HISTOPATHOLOGIC CHANGES - NEGATIVE FOR INTESTINAL METAPLASIA OR DYSPLASIA      Colonoscopy 05/24/2015 - normal - no polyps     EGD 06/06/21: - Esophagogastric landmarks were identified: the Z-line was found at 30 cm, the gastroesophageal junction was found at 31 cm and the upper extent of the gastric folds was found at 36 cm from the incisors. Findings: - A 5 cm hiatal hernia was present. - The Z-line was irregular with extension of salmon colored mucosa extending up about 1cm or so from the GEJ. Biopsies were taken with a cold forceps for histology. There was interval healing of esophagitis on protonix. - One benign-appearing, intrinsic mild stenosis was found 31 cm from the incisors. This stenosis measured less than one cm (in length). A TTS dilator was passed through the scope. Dilation with a 13.5-14.5-15.5 mm balloon dilator was performed to 15.5 mm. - A diverticulum with a small opening was found at the gastroesophageal junction. - The exam of the esophagus was  otherwise normal. - The entire examined stomach was normal. - The duodenal bulb and second portion of the duodenum were normal.   Surgical [P], GE junction - REACTIVE SQUAMOCOLUMNAR JUNCTION WITH CHRONIC INFLAMMATION - NO INTESTINAL METAPLASIA, DYSPLASIA OR MALIGNANCY IDENTIFIED    Past Medical History:  Diagnosis Date   Allergy    seasonal   Anemia    Anxiety    Blood transfusion without reported diagnosis    years ago   Degenerative cervical disc    Depression    GERD  (gastroesophageal reflux disease)    Headache(784.0)    Hypertension      Past Surgical History:  Procedure Laterality Date   COLONOSCOPY     EYE SURGERY     TUBAL LIGATION     Family History  Problem Relation Age of Onset   Heart disease Mother    Alcohol abuse Other    Anxiety disorder Other    Depression Other    Hyperlipidemia Other    Hypertension Other    Colon cancer Neg Hx    Esophageal cancer Neg Hx    Stomach cancer Neg Hx    Rectal cancer Neg Hx    Breast cancer Neg Hx    Social History   Tobacco Use   Smoking status: Never   Smokeless tobacco: Never  Vaping Use   Vaping Use: Never used  Substance Use Topics   Alcohol use: No   Drug use: No   Current Outpatient Medications  Medication Sig Dispense Refill   aspirin 81 MG tablet Take 81 mg by mouth daily.     calcium citrate-vitamin D (CITRACAL+D) 315-200 MG-UNIT per tablet Take 1 tablet by mouth daily.     conjugated estrogens (PREMARIN) vaginal cream Place 0.5 Applicatorfuls vaginally daily. 42.5 g 11   fish oil-omega-3 fatty acids 1000 MG capsule Take 1 g by mouth daily.     pantoprazole (PROTONIX) 40 MG tablet TAKE 1 TABLET (40 MG TOTAL) BY MOUTH DAILY. PLEASE SCHEDULE A YEARLY OFFICE VISIT WITH DR. Cleda Mccreedy 90 tablet 0   rosuvastatin (CRESTOR) 20 MG tablet Take 20 mg by mouth once a week.     sertraline (ZOLOFT) 50 MG tablet TAKE 1 TABLET BY MOUTH EVERY DAY 90 tablet 1   sucralfate (CARAFATE) 1 GM/10ML suspension TAKE 10 MLS (1 G TOTAL) BY MOUTH EVERY 6 (SIX) HOURS AS NEEDED. 240 mL 3   triamterene-hydrochlorothiazide (DYAZIDE) 37.5-25 MG capsule TAKE 1 EACH (1 CAPSULE TOTAL) BY MOUTH DAILY. 90 capsule 1   No current facility-administered medications for this visit.   No Known Allergies   Review of Systems: All systems reviewed and negative except where noted in HPI.   Lab Results  Component Value Date   WBC 5.7 07/31/2022   HGB 13.7 07/31/2022   HCT 40.1 07/31/2022   MCV 90.7 07/31/2022    PLT 385.0 07/31/2022    Lab Results  Component Value Date   CREATININE 0.77 07/31/2022   BUN 18 07/31/2022   NA 138 07/31/2022   K 3.9 07/31/2022   CL 100 07/31/2022   CO2 30 07/31/2022    Lab Results  Component Value Date   ALT 16 07/31/2022   AST 20 07/31/2022   ALKPHOS 65 07/31/2022   BILITOT 0.6 07/31/2022     Physical Exam: BP 120/70   Pulse 88   Ht '5\' 1"'$  (1.549 m)   Wt 122 lb 3.2 oz (55.4 kg)   BMI 23.09 kg/m  Constitutional: Pleasant,well-developed, female in no  acute distress. Neurological: Alert and oriented to person place and time. Psychiatric: Normal mood and affect. Behavior is normal.   ASSESSMENT: 77 y.o. female here for assessment of the following  1. Gastroesophageal reflux disease, unspecified whether esophagitis present   2. Hiatal hernia   3. Long-term current use of proton pump inhibitor therapy    Moderate size hiatal hernia associated with esophagitis in the past and GEJ stricturing.  On PPI and status post dilation, she has not had any recurrent dysphagia, has had interval healing of esophagitis without Barrett's esophagus.  We discussed that if she is not on PPI suspect she will have recurrent symptoms in light of her hiatal hernia.  In regards to management of that, she would prefer not to have a surgery to repair the hernia, but understands she will likely need indefinite medical therapy for this.  We discussed long-term risks of chronic PPI use to include CKD, increased risk for bone fracture, C. difficile, etc.  Long-term want to use the lowest dose needed to control her symptoms.  After discussion of options, we will try decreasing her to 20 mg daily and see how she does.  If she needs to use Carafate more frequently or having frequent breakthrough then she can increase back to 40 mg daily of Protonix if needed.  She agrees with the plan, can follow-up with me yearly moving forward if not sooner with any issues.    PLAN: - discussed long term  risks of chronic PPI use - she wishes to hold off on hiatal hernia repair - continue protonix, will try dose decrease to '20mg'$  / day. Cut pills in half and if does not tolerate it will go back to '40mg'$  - continue carafate PRN - f/u yearly if not sooner with any symptoms or issues  Jolly Mango, MD [facility]

## 2022-11-14 NOTE — Patient Instructions (Addendum)
If your blood pressure at your visit was 140/90 or greater, please contact your primary care physician to follow up on this.  _______________________________________________________  If you are age 76 or older, your body mass index should be between 23-30. Your Body mass index is 23.09 kg/m. If this is out of the aforementioned range listed, please consider follow up with your Primary Care Provider.  If you are age 98 or younger, your body mass index should be between 19-25. Your Body mass index is 23.09 kg/m. If this is out of the aformentioned range listed, please consider follow up with your Primary Care Provider.   ________________________________________________________  The [facility] providers would like to encourage you to use [facility] to communicate with providers for non-urgent requests or questions.  Due to long hold times on the telephone, sending your provider a message by [facility] may be a faster and more efficient way to get a response.  Please allow 48 business hours for a response.  Please remember that this is for non-urgent requests.  _______________________________________________________  Due to recent changes in healthcare laws, you may see the results of your imaging and laboratory studies on MyChart before your provider has had a chance to review them.  We understand that in some cases there may be results that are confusing or concerning to you. Not all laboratory results come back in the same time frame and the provider may be waiting for multiple results in order to interpret others.  Please give [place] 48 hours in order for your provider to thoroughly review all the results before contacting the office for clarification of your results.    Continue Carafate.  Reduce Protonix to 20 mg daily. If not tolerated, may return to 40 mg daily.  Please follow up in 1 year.  Thank you for entrusting me with your care and for choosing [facility], Dr. Lochbuie Cellar

## 2022-11-15 DIAGNOSIS — N3941 Urge incontinence: Secondary | ICD-10-CM | POA: Diagnosis not present

## 2022-11-15 DIAGNOSIS — N8111 Cystocele, midline: Secondary | ICD-10-CM | POA: Diagnosis not present

## 2022-11-15 DIAGNOSIS — M6281 Muscle weakness (generalized): Secondary | ICD-10-CM | POA: Diagnosis not present

## 2022-11-15 DIAGNOSIS — R278 Other lack of coordination: Secondary | ICD-10-CM | POA: Diagnosis not present

## 2022-12-05 DIAGNOSIS — N8111 Cystocele, midline: Secondary | ICD-10-CM | POA: Diagnosis not present

## 2022-12-05 DIAGNOSIS — Z4689 Encounter for fitting and adjustment of other specified devices: Secondary | ICD-10-CM | POA: Diagnosis not present

## 2022-12-15 ENCOUNTER — Other Ambulatory Visit: Payer: Self-pay | Admitting: Adult Health

## 2022-12-15 DIAGNOSIS — F329 Major depressive disorder, single episode, unspecified: Secondary | ICD-10-CM

## 2023-01-16 DIAGNOSIS — H40011 Open angle with borderline findings, low risk, right eye: Secondary | ICD-10-CM | POA: Diagnosis not present

## 2023-01-16 DIAGNOSIS — H2513 Age-related nuclear cataract, bilateral: Secondary | ICD-10-CM | POA: Diagnosis not present

## 2023-01-16 DIAGNOSIS — D3132 Benign neoplasm of left choroid: Secondary | ICD-10-CM | POA: Diagnosis not present

## 2023-02-15 DIAGNOSIS — Z4689 Encounter for fitting and adjustment of other specified devices: Secondary | ICD-10-CM | POA: Diagnosis not present

## 2023-02-15 DIAGNOSIS — N8111 Cystocele, midline: Secondary | ICD-10-CM | POA: Diagnosis not present

## 2023-02-26 ENCOUNTER — Other Ambulatory Visit: Payer: Self-pay | Admitting: Gastroenterology

## 2023-03-05 ENCOUNTER — Ambulatory Visit: Payer: Medicare PPO | Admitting: Family Medicine

## 2023-05-15 DIAGNOSIS — L814 Other melanin hyperpigmentation: Secondary | ICD-10-CM | POA: Diagnosis not present

## 2023-05-15 DIAGNOSIS — Z85828 Personal history of other malignant neoplasm of skin: Secondary | ICD-10-CM | POA: Diagnosis not present

## 2023-05-15 DIAGNOSIS — D225 Melanocytic nevi of trunk: Secondary | ICD-10-CM | POA: Diagnosis not present

## 2023-05-15 DIAGNOSIS — L821 Other seborrheic keratosis: Secondary | ICD-10-CM | POA: Diagnosis not present

## 2023-05-29 ENCOUNTER — Other Ambulatory Visit: Payer: Self-pay | Admitting: Adult Health

## 2023-05-29 DIAGNOSIS — I10 Essential (primary) hypertension: Secondary | ICD-10-CM

## 2023-05-30 DIAGNOSIS — Z4689 Encounter for fitting and adjustment of other specified devices: Secondary | ICD-10-CM | POA: Diagnosis not present

## 2023-05-30 DIAGNOSIS — N8111 Cystocele, midline: Secondary | ICD-10-CM | POA: Diagnosis not present

## 2023-06-12 DIAGNOSIS — D320 Benign neoplasm of cerebral meninges: Secondary | ICD-10-CM | POA: Diagnosis not present

## 2023-06-16 ENCOUNTER — Other Ambulatory Visit: Payer: Self-pay | Admitting: Adult Health

## 2023-06-16 DIAGNOSIS — F329 Major depressive disorder, single episode, unspecified: Secondary | ICD-10-CM

## 2023-06-28 DIAGNOSIS — D320 Benign neoplasm of cerebral meninges: Secondary | ICD-10-CM | POA: Diagnosis not present

## 2023-08-06 ENCOUNTER — Ambulatory Visit: Payer: Medicare PPO | Admitting: Adult Health

## 2023-08-06 ENCOUNTER — Encounter: Payer: Self-pay | Admitting: Adult Health

## 2023-08-06 VITALS — BP 130/70 | HR 72 | Temp 97.8°F | Ht 60.0 in | Wt 117.0 lb

## 2023-08-06 DIAGNOSIS — I10 Essential (primary) hypertension: Secondary | ICD-10-CM | POA: Diagnosis not present

## 2023-08-06 DIAGNOSIS — Z0000 Encounter for general adult medical examination without abnormal findings: Secondary | ICD-10-CM | POA: Diagnosis not present

## 2023-08-06 DIAGNOSIS — R7303 Prediabetes: Secondary | ICD-10-CM

## 2023-08-06 DIAGNOSIS — F419 Anxiety disorder, unspecified: Secondary | ICD-10-CM | POA: Diagnosis not present

## 2023-08-06 DIAGNOSIS — E782 Mixed hyperlipidemia: Secondary | ICD-10-CM | POA: Diagnosis not present

## 2023-08-06 DIAGNOSIS — F32A Depression, unspecified: Secondary | ICD-10-CM | POA: Diagnosis not present

## 2023-08-06 DIAGNOSIS — K219 Gastro-esophageal reflux disease without esophagitis: Secondary | ICD-10-CM

## 2023-08-06 DIAGNOSIS — Z23 Encounter for immunization: Secondary | ICD-10-CM | POA: Diagnosis not present

## 2023-08-06 LAB — LIPID PANEL
Cholesterol: 223 mg/dL — ABNORMAL HIGH (ref 0–200)
HDL: 51.7 mg/dL (ref >39.00)
LDL Cholesterol: 137 mg/dL — ABNORMAL HIGH (ref 0–99)
NonHDL: 171.47
Total CHOL/HDL Ratio: 4
Triglycerides: 173 mg/dL — ABNORMAL HIGH (ref 0.0–149.0)
VLDL: 34.6 mg/dL (ref 0.0–40.0)

## 2023-08-06 LAB — CBC WITH DIFFERENTIAL/PLATELET
Basophils Absolute: 0.1 10*3/uL (ref 0.0–0.1)
Basophils Relative: 1 % (ref 0.0–3.0)
Eosinophils Absolute: 0.1 10*3/uL (ref 0.0–0.7)
Eosinophils Relative: 1.9 % (ref 0.0–5.0)
HCT: 40.9 % (ref 36.0–46.0)
Hemoglobin: 13.5 g/dL (ref 12.0–15.0)
Lymphocytes Relative: 32 % (ref 12.0–46.0)
Lymphs Abs: 2 10*3/uL (ref 0.7–4.0)
MCHC: 33 g/dL (ref 30.0–36.0)
MCV: 91.1 fL (ref 78.0–100.0)
Monocytes Absolute: 0.5 10*3/uL (ref 0.1–1.0)
Monocytes Relative: 7.7 % (ref 3.0–12.0)
Neutro Abs: 3.6 10*3/uL (ref 1.4–7.7)
Neutrophils Relative %: 57.4 % (ref 43.0–77.0)
Platelets: 419 10*3/uL — ABNORMAL HIGH (ref 150.0–400.0)
RBC: 4.49 Mil/uL (ref 3.87–5.11)
RDW: 13.1 % (ref 11.5–15.5)
WBC: 6.3 10*3/uL (ref 4.0–10.5)

## 2023-08-06 LAB — COMPREHENSIVE METABOLIC PANEL
ALT: 16 U/L (ref 0–35)
AST: 22 U/L (ref 0–37)
Albumin: 4.4 g/dL (ref 3.5–5.2)
Alkaline Phosphatase: 71 U/L (ref 39–117)
BUN: 16 mg/dL (ref 6–23)
CO2: 31 meq/L (ref 19–32)
Calcium: 9.9 mg/dL (ref 8.4–10.5)
Chloride: 100 meq/L (ref 96–112)
Creatinine, Ser: 0.69 mg/dL (ref 0.40–1.20)
GFR: 84.16 mL/min (ref >60.00)
Glucose, Bld: 92 mg/dL (ref 70–99)
Potassium: 4.2 meq/L (ref 3.5–5.1)
Sodium: 139 meq/L (ref 135–145)
Total Bilirubin: 0.5 mg/dL (ref 0.2–1.2)
Total Protein: 7.2 g/dL (ref 6.0–8.3)

## 2023-08-06 LAB — HEMOGLOBIN A1C: Hgb A1c MFr Bld: 6 % (ref 4.6–6.5)

## 2023-08-06 LAB — TSH: TSH: 1.96 u[IU]/mL (ref 0.35–5.50)

## 2023-08-06 MED ORDER — SUCRALFATE 1 GM/10ML PO SUSP: 1.0000 g | Freq: Four times a day (QID) | ORAL | 3 refills | Status: AC | PRN

## 2023-08-06 NOTE — Patient Instructions (Signed)
It was great seeing you today   We will follow up with you regarding your lab work   Please let me know if you need anything   

## 2023-08-06 NOTE — Progress Notes (Signed)
Subjective:    Patient ID: Sally Shea, female    DOB: 01/17/1946, 77 y.o.   MRN: [id number]  HPI Patient presents for yearly preventative medicine examination. He is a pleasant 77 year old female who  has a past medical history of Allergy, Anemia, Anxiety, Blood transfusion without reported diagnosis, Degenerative cervical disc, Depression, GERD (gastroesophageal reflux disease), Headache(784.0), and Hypertension.  Hypertension-managed with Dyazide 37.5-25 mg daily controlled on this medication.  She denies dizziness, lightheadedness, chest pain, shortness of breath. BP Readings from Last 3 Encounters:  08/06/23 130/70  11/14/22 120/70  09/14/22 120/80   Anxiety/depression-well managed with Zoloft 50 mg daily  GERD-currently on Protonix 20 mg daily.  She does feel well controlled on this medication. She will use Carafate every so often for flares.   Hyperlipidemia-managed with Crestor 20 mg weekly.  She denies myalgia or fatigue when she takes this weekly. Lab Results  Component Value Date   CHOL 237 (H) 07/31/2022   HDL 58.20 07/31/2022   LDLCALC 158 (H) 07/31/2022   LDLDIRECT 176.0 06/13/2011   TRIG 105.0 07/31/2022   CHOLHDL 4 07/31/2022   Pre Diabetes - not currently on medication.  Lab Results  Component Value Date   HGBA1C 6.0 07/31/2022   HGBA1C 6.0 07/25/2021    All immunizations and health maintenance protocols were reviewed with the patient and needed orders were placed.  Appropriate screening laboratory values were ordered for the patient including screening of hyperlipidemia, renal function and hepatic function.  Medication reconciliation,  past medical history, social history, problem list and allergies were reviewed in detail with the patient  Goals were established with regard to weight loss, exercise, and  diet in compliance with medications. She does stay active and has started eating healthier to help with her GERD symptoms  Wt Readings from Last 3  Encounters:  08/06/23 117 lb (53.1 kg)  11/14/22 122 lb 3.2 oz (55.4 kg)  09/14/22 121 lb (54.9 kg)     She will call and schedule her mammogram.   Review of Systems  Constitutional: Negative.   HENT: Negative.    Eyes: Negative.   Respiratory: Negative.    Cardiovascular: Negative.   Gastrointestinal: Negative.   Endocrine: Negative.   Genitourinary: Negative.   Musculoskeletal:  Positive for arthralgias.  Skin: Negative.   Allergic/Immunologic: Negative.   Neurological: Negative.   Hematological: Negative.   Psychiatric/Behavioral: Negative.     Past Medical History:  Diagnosis Date   Allergy    seasonal   Anemia    Anxiety    Blood transfusion without reported diagnosis    years ago   Degenerative cervical disc    Depression    GERD (gastroesophageal reflux disease)    Headache(784.0)    Hypertension     Social History   Socioeconomic History   Marital status: Married    Spouse name: Not on file   Number of children: Not on file   Years of education: Not on file   Highest education level: Bachelor's degree (e.g., BA, AB, BS)  Occupational History   Not on file  Tobacco Use   Smoking status: Never   Smokeless tobacco: Never  Vaping Use   Vaping status: Never Used  Substance and Sexual Activity   Alcohol use: No   Drug use: No   Sexual activity: Not on file  Other Topics Concern   Not on file  Social History Narrative   Not on file   Social Determinants of Health  Financial Resource Strain: Low Risk  (02/07/2022)   Overall Financial Resource Strain (CARDIA)    Difficulty of Paying Living Expenses: Not hard at all  Food Insecurity: No Food Insecurity (02/07/2022)   Hunger Vital Sign    Worried About Running Out of Food in the Last Year: Never true    Ran Out of Food in the Last Year: Never true  Transportation Needs: No Transportation Needs (02/07/2022)   PRAPARE - Administrator, Civil Service (Medical): No    Lack of Transportation  (Non-Medical): No  Physical Activity: Sufficiently Active (02/07/2022)   Exercise Vital Sign    Days of Exercise per Week: 7 days    Minutes of Exercise per Session: 50 min  Stress: No Stress Concern Present (02/07/2022)   [employer] of Occupational Health - Occupational Stress Questionnaire    Feeling of Stress : Not at all  Social Connections: Socially Integrated (02/07/2022)   Social Connection and Isolation Panel [NHANES]    Frequency of Communication with Friends and Family: More than three times a week    Frequency of Social Gatherings with Friends and Family: More than three times a week    Attends Religious Services: More than 4 times per year    Active Member of [employer] or Organizations: Yes    Attends Banker Meetings: 1 to 4 times per year    Marital Status: Married  Catering manager Violence: Not At Risk (02/07/2022)   Humiliation, Afraid, Rape, and Kick questionnaire    Fear of Current or Ex-Partner: No    Emotionally Abused: No    Physically Abused: No    Sexually Abused: No    Past Surgical History:  Procedure Laterality Date   COLONOSCOPY     EYE SURGERY     TUBAL LIGATION      Family History  Problem Relation Age of Onset   Heart disease Mother    Alcohol abuse Other    Anxiety disorder Other    Depression Other    Hyperlipidemia Other    Hypertension Other    Colon cancer Neg Hx    Esophageal cancer Neg Hx    Stomach cancer Neg Hx    Rectal cancer Neg Hx    Breast cancer Neg Hx     Allergies  Allergen Reactions   Misc. Sulfonamide Containing Compounds     Current Outpatient Medications on File Prior to Visit  Medication Sig Dispense Refill   aspirin 81 MG tablet Take 81 mg by mouth daily.     fish oil-omega-3 fatty acids 1000 MG capsule Take 1 g by mouth daily.     Multiple Vitamins-Minerals (PRESERVISION AREDS 2 PO) Take by mouth.     pantoprazole (PROTONIX) 20 MG tablet Take 1-2 tablets (20-40 mg total) by mouth daily. 60 tablet 5    rosuvastatin (CRESTOR) 20 MG tablet Take 20 mg by mouth once a week.     sertraline (ZOLOFT) 50 MG tablet TAKE 1 TABLET BY MOUTH EVERY DAY 90 tablet 1   sucralfate (CARAFATE) 1 GM/10ML suspension TAKE 10 MLS (1 G TOTAL) BY MOUTH EVERY 6 (SIX) HOURS AS NEEDED. 240 mL 3   triamterene-hydrochlorothiazide (DYAZIDE) 37.5-25 MG capsule TAKE 1 EACH (1 CAPSULE TOTAL) BY MOUTH DAILY. 90 capsule 1   [DISCONTINUED] pantoprazole (PROTONIX) 40 MG tablet Take 1 tablet (40 mg total) by mouth daily. Please keep your September appointment for further refills. Thank you 30 tablet 1   No current facility-administered medications on  file prior to visit.    BP 130/70   Pulse 72   Temp 97.8 F (36.6 C) (Oral)   Ht 5' (1.524 m)   Wt 117 lb (53.1 kg)   SpO2 97%   BMI 22.85 kg/m       Objective:   Physical Exam Vitals and nursing note reviewed.  Constitutional:      General: She is not in acute distress.    Appearance: Normal appearance. She is not ill-appearing.  HENT:     Head: Normocephalic and atraumatic.     Right Ear: Tympanic membrane, ear canal and external ear normal. There is no impacted cerumen.     Left Ear: Tympanic membrane, ear canal and external ear normal. There is no impacted cerumen.     Nose: Nose normal. No congestion or rhinorrhea.     Mouth/Throat:     Mouth: Mucous membranes are moist.     Pharynx: Oropharynx is clear.  Eyes:     Extraocular Movements: Extraocular movements intact.     Conjunctiva/sclera: Conjunctivae normal.     Pupils: Pupils are equal, round, and reactive to light.  Neck:     Vascular: No carotid bruit.  Cardiovascular:     Rate and Rhythm: Normal rate and regular rhythm.     Pulses: Normal pulses.     Heart sounds: No murmur heard.    No friction rub. No gallop.  Pulmonary:     Effort: Pulmonary effort is normal.     Breath sounds: Normal breath sounds.  Abdominal:     General: Abdomen is flat. Bowel sounds are normal. There is no distension.      Palpations: Abdomen is soft. There is no mass.     Tenderness: There is no abdominal tenderness. There is no guarding or rebound.     Hernia: No hernia is present.  Musculoskeletal:        General: Normal range of motion.     Cervical back: Normal range of motion and neck supple.  Lymphadenopathy:     Cervical: No cervical adenopathy.  Skin:    General: Skin is warm and dry.     Capillary Refill: Capillary refill takes less than 2 seconds.  Neurological:     General: No focal deficit present.     Mental Status: She is alert and oriented to person, place, and time.  Psychiatric:        Mood and Affect: Mood normal.        Behavior: Behavior normal.        Thought Content: Thought content normal.        Judgment: Judgment normal.       Assessment & Plan:  1. Routine general medical examination at a health care facility Today patient counseled on age appropriate routine health concerns for screening and prevention, each reviewed and up to date or declined. Immunizations reviewed and up to date or declined. Labs ordered and reviewed. Risk factors for depression reviewed and negative. Hearing function and visual acuity are intact. ADLs screened and addressed as needed. Functional ability and level of safety reviewed and appropriate. Education, counseling and referrals performed based on assessed risks today. Patient provided with a copy of personalized plan for preventive services. - Continue to stay active and exercise.  - Follow up in one year or sooner if needed   2. Essential hypertension - Well controlled. No change in medication  - CBC with Differential/Platelet; Future - Comprehensive metabolic panel; Future - Lipid panel;  Future - TSH; Future  3. Mixed hyperlipidemia - Consider change in statin dosing  - CBC with Differential/Platelet; Future - Comprehensive metabolic panel; Future - Lipid panel; Future - TSH; Future  4. Anxiety and depression - Well controlled.  -  CBC with Differential/Platelet; Future - Comprehensive metabolic panel; Future - Lipid panel; Future - TSH; Future  5. Gastroesophageal reflux disease without esophagitis - Per GI  - CBC with Differential/Platelet; Future - Comprehensive metabolic panel; Future - Lipid panel; Future - TSH; Future  6. Pre-diabetes - Consider metformin  - CBC with Differential/Platelet; Future - Comprehensive metabolic panel; Future - Lipid panel; Future - TSH; Future - Hemoglobin A1c; Future  7. Need for influenza vaccination  - Flu Vaccine Trivalent High Dose (Fluad)  Shirline Frees, NP

## 2023-08-12 DIAGNOSIS — H40011 Open angle with borderline findings, low risk, right eye: Secondary | ICD-10-CM | POA: Diagnosis not present

## 2023-08-26 DIAGNOSIS — Z4689 Encounter for fitting and adjustment of other specified devices: Secondary | ICD-10-CM | POA: Diagnosis not present

## 2023-08-26 DIAGNOSIS — N8111 Cystocele, midline: Secondary | ICD-10-CM | POA: Diagnosis not present

## 2023-09-04 HISTORY — PX: TOTAL SHOULDER REPLACEMENT: SUR1217

## 2023-09-16 ENCOUNTER — Other Ambulatory Visit: Payer: Self-pay | Admitting: Adult Health

## 2023-09-16 DIAGNOSIS — Z1231 Encounter for screening mammogram for malignant neoplasm of breast: Secondary | ICD-10-CM

## 2023-09-27 ENCOUNTER — Ambulatory Visit
Admission: RE | Admit: 2023-09-27 | Discharge: 2023-09-27 | Disposition: A | Discharge status: Home / Self Care | Payer: Medicare PPO | Source: Ambulatory Visit | Attending: Adult Health | Admitting: Adult Health

## 2023-09-27 DIAGNOSIS — Z1231 Encounter for screening mammogram for malignant neoplasm of breast: Secondary | ICD-10-CM

## 2023-09-30 ENCOUNTER — Encounter: Payer: Self-pay | Admitting: Gastroenterology

## 2023-10-11 DIAGNOSIS — M25811 Other specified joint disorders, right shoulder: Secondary | ICD-10-CM | POA: Diagnosis not present

## 2023-10-11 DIAGNOSIS — M25511 Pain in right shoulder: Secondary | ICD-10-CM | POA: Diagnosis not present

## 2023-11-20 DIAGNOSIS — N8111 Cystocele, midline: Secondary | ICD-10-CM | POA: Diagnosis not present

## 2023-11-20 DIAGNOSIS — Z4689 Encounter for fitting and adjustment of other specified devices: Secondary | ICD-10-CM | POA: Diagnosis not present

## 2023-12-05 ENCOUNTER — Other Ambulatory Visit: Payer: Self-pay | Admitting: Adult Health

## 2023-12-05 DIAGNOSIS — I10 Essential (primary) hypertension: Secondary | ICD-10-CM

## 2023-12-11 ENCOUNTER — Other Ambulatory Visit: Payer: Self-pay | Admitting: Adult Health

## 2023-12-11 DIAGNOSIS — F329 Major depressive disorder, single episode, unspecified: Secondary | ICD-10-CM

## 2023-12-23 ENCOUNTER — Other Ambulatory Visit: Payer: Self-pay | Admitting: Adult Health

## 2023-12-23 DIAGNOSIS — I10 Essential (primary) hypertension: Secondary | ICD-10-CM

## 2023-12-23 MED ORDER — TRIAMTERENE-HCTZ 37.5-25 MG PO CAPS: 1.0000 | ORAL_CAPSULE | Freq: Every day | ORAL | 1 refills | Status: AC

## 2024-02-24 DIAGNOSIS — M19011 Primary osteoarthritis, right shoulder: Secondary | ICD-10-CM | POA: Diagnosis not present

## 2024-02-27 ENCOUNTER — Other Ambulatory Visit: Payer: Self-pay | Admitting: Obstetrics & Gynecology

## 2024-02-27 DIAGNOSIS — N951 Menopausal and female climacteric states: Secondary | ICD-10-CM | POA: Diagnosis not present

## 2024-02-27 DIAGNOSIS — N8111 Cystocele, midline: Secondary | ICD-10-CM | POA: Diagnosis not present

## 2024-02-27 DIAGNOSIS — Z4689 Encounter for fitting and adjustment of other specified devices: Secondary | ICD-10-CM | POA: Diagnosis not present

## 2024-02-27 DIAGNOSIS — N952 Postmenopausal atrophic vaginitis: Secondary | ICD-10-CM | POA: Diagnosis not present

## 2024-03-12 ENCOUNTER — Encounter: Payer: Self-pay | Admitting: Adult Health

## 2024-03-12 ENCOUNTER — Ambulatory Visit: Admitting: Adult Health

## 2024-03-12 VITALS — BP 110/70 | HR 89 | Temp 98.6°F | Ht 60.0 in | Wt 114.0 lb

## 2024-03-12 DIAGNOSIS — Z01818 Encounter for other preprocedural examination: Secondary | ICD-10-CM

## 2024-03-12 NOTE — Progress Notes (Signed)
 Subjective:    Patient ID: Sally Shea, female    DOB: [birth year], 78 y.o.   MRN: [id number]  HPI 78 year old female who  has a past medical history of Allergy, Anemia, Anxiety, Blood transfusion without reported diagnosis, Degenerative cervical disc, Depression, GERD (gastroesophageal reflux disease), Headache(784.0), and Hypertension.  She presents to the office today for pre operative clearance.  She is planning on having right total reverse replacement.  Will be done at [facility] by Dr. Humphrey in [place] .   Review of Systems See HPI   Past Medical History:  Diagnosis Date   Allergy    seasonal   Anemia    Anxiety    Blood transfusion without reported diagnosis    years ago   Degenerative cervical disc    Depression    GERD (gastroesophageal reflux disease)    Headache(784.0)    Hypertension     Social History   Socioeconomic History   Marital status: Married    Spouse name: Not on file   Number of children: Not on file   Years of education: Not on file   Highest education level: Bachelor's degree (e.g., BA, AB, BS)  Occupational History   Not on file  Tobacco Use   Smoking status: Never   Smokeless tobacco: Never  Vaping Use   Vaping status: Never Used  Substance and Sexual Activity   Alcohol use: No   Drug use: No   Sexual activity: Not on file  Other Topics Concern   Not on file  Social History Narrative   Not on file   Social Drivers of Health   Financial Resource Strain: Low Risk  (02/07/2022)   Overall Financial Resource Strain (CARDIA)    Difficulty of Paying Living Expenses: Not hard at all  Food Insecurity: No Food Insecurity (02/07/2022)   Hunger Vital Sign    Worried About Running Out of Food in the Last Year: Never true    Ran Out of Food in the Last Year: Never true  Transportation Needs: No Transportation Needs (02/07/2022)   PRAPARE - Administrator, Civil Service (Medical): No    Lack of Transportation  (Non-Medical): No  Physical Activity: Sufficiently Active (02/07/2022)   Exercise Vital Sign    Days of Exercise per Week: 7 days    Minutes of Exercise per Session: 50 min  Stress: No Stress Concern Present (02/07/2022)   [employer] of Occupational Health - Occupational Stress Questionnaire    Feeling of Stress : Not at all  Social Connections: Socially Integrated (02/07/2022)   Social Connection and Isolation Panel    Frequency of Communication with Friends and Family: More than three times a week    Frequency of Social Gatherings with Friends and Family: More than three times a week    Attends Religious Services: More than 4 times per year    Active Member of [employer] or Organizations: Yes    Attends Banker Meetings: 1 to 4 times per year    Marital Status: Married  Catering manager Violence: Not At Risk (02/07/2022)   Humiliation, Afraid, Rape, and Kick questionnaire    Fear of Current or Ex-Partner: No    Emotionally Abused: No    Physically Abused: No    Sexually Abused: No    Past Surgical History:  Procedure Laterality Date   COLONOSCOPY     EYE SURGERY     TUBAL LIGATION      Family History  Problem Relation Age of Onset   Heart disease Mother    Alcohol abuse Other    Anxiety disorder Other    Depression Other    Hyperlipidemia Other    Hypertension Other    Colon cancer Neg Hx    Esophageal cancer Neg Hx    Stomach cancer Neg Hx    Rectal cancer Neg Hx    Breast cancer Neg Hx    BRCA 1/2 Neg Hx     Allergies  Allergen Reactions   Misc. Sulfonamide Containing Compounds     Current Outpatient Medications on File Prior to Visit  Medication Sig Dispense Refill   aspirin 81 MG tablet Take 81 mg by mouth daily.     fish oil-omega-3 fatty acids 1000 MG capsule Take 1 g by mouth daily.     Multiple Vitamins-Minerals (PRESERVISION AREDS 2 PO) Take by mouth.     pantoprazole  (PROTONIX ) 20 MG tablet Take 1-2 tablets (20-40 mg total) by mouth daily.  60 tablet 5   rosuvastatin  (CRESTOR ) 20 MG tablet Take 20 mg by mouth once a week.     sertraline  (ZOLOFT ) 50 MG tablet TAKE 1 TABLET BY MOUTH EVERY DAY 90 tablet 1   sucralfate  (CARAFATE ) 1 GM/10ML suspension Take 10 mLs (1 g total) by mouth every 6 (six) hours as needed. 240 mL 3   triamterene -hydrochlorothiazide  (DYAZIDE) 37.5-25 MG capsule Take 1 each (1 capsule total) by mouth daily. 90 capsule 1   [DISCONTINUED] pantoprazole  (PROTONIX ) 40 MG tablet Take 1 tablet (40 mg total) by mouth daily. Please keep your September appointment for further refills. Thank you 30 tablet 1   No current facility-administered medications on file prior to visit.    BP 110/70   Pulse 89   Temp 98.6 F (37 C) (Oral)   Ht 5' (1.524 m)   Wt 114 lb (51.7 kg)   SpO2 97%   BMI 22.26 kg/m       Objective:   Physical Exam Vitals and nursing note reviewed.  Constitutional:      Appearance: Normal appearance. She is obese.  Cardiovascular:     Rate and Rhythm: Normal rate and regular rhythm.     Pulses: Normal pulses.     Heart sounds: Normal heart sounds.  Pulmonary:     Effort: Pulmonary effort is normal.     Breath sounds: Normal breath sounds.  Musculoskeletal:        General: Tenderness present.     Right shoulder: Bony tenderness present. Decreased range of motion. Decreased strength.  Skin:    General: Skin is warm and dry.  Neurological:     General: No focal deficit present.     Mental Status: She is alert and oriented to person, place, and time.  Psychiatric:        Mood and Affect: Mood normal.        Behavior: Behavior normal.        Thought Content: Thought content normal.        Judgment: Judgment normal.        Assessment & Plan:  1. Preoperative clearance (Primary)  - CBC; Future - Comprehensive metabolic panel with GFR; Future - Hemoglobin A1c; Future - Protime-INR; Future - APTT; Future - EKG 12-Lead- NSR, Rate 81. VLH. Consistent with previous EKGs   Darleene Shape, NP

## 2024-03-13 ENCOUNTER — Other Ambulatory Visit: Payer: Self-pay | Admitting: Adult Health

## 2024-03-13 ENCOUNTER — Ambulatory Visit: Payer: Self-pay | Admitting: Adult Health

## 2024-03-13 DIAGNOSIS — E876 Hypokalemia: Secondary | ICD-10-CM

## 2024-03-13 LAB — COMPREHENSIVE METABOLIC PANEL WITH GFR
ALT: 15 U/L (ref 0–35)
AST: 23 U/L (ref 0–37)
Albumin: 4.2 g/dL (ref 3.5–5.2)
Alkaline Phosphatase: 92 U/L (ref 39–117)
BUN: 24 mg/dL — ABNORMAL HIGH (ref 6–23)
CO2: 30 meq/L (ref 19–32)
Calcium: 9 mg/dL (ref 8.4–10.5)
Chloride: 98 meq/L (ref 96–112)
Creatinine, Ser: 0.88 mg/dL (ref 0.40–1.20)
GFR: 63.46 mL/min (ref >60.00)
Glucose, Bld: 104 mg/dL — ABNORMAL HIGH (ref 70–99)
Potassium: 3.1 meq/L — ABNORMAL LOW (ref 3.5–5.1)
Sodium: 136 meq/L (ref 135–145)
Total Bilirubin: 0.2 mg/dL (ref 0.2–1.2)
Total Protein: 6.9 g/dL (ref 6.0–8.3)

## 2024-03-13 LAB — HEMOGLOBIN A1C: Hgb A1c MFr Bld: 6.3 % (ref 4.6–6.5)

## 2024-03-13 LAB — CBC
HCT: 34.5 % — ABNORMAL LOW (ref 36.0–46.0)
Hemoglobin: 11.5 g/dL — ABNORMAL LOW (ref 12.0–15.0)
MCHC: 33.3 g/dL (ref 30.0–36.0)
MCV: 83.9 fl (ref 78.0–100.0)
Platelets: 427 K/uL — ABNORMAL HIGH (ref 150.0–400.0)
RBC: 4.11 Mil/uL (ref 3.87–5.11)
RDW: 15.2 % (ref 11.5–15.5)
WBC: 6.1 K/uL (ref 4.0–10.5)

## 2024-03-13 LAB — PROTIME-INR
INR: 1 ratio (ref 0.8–1.0)
Prothrombin Time: 10.6 s (ref 9.6–13.1)

## 2024-03-13 LAB — APTT: aPTT: 31.9 s (ref 25.4–36.8)

## 2024-03-16 ENCOUNTER — Other Ambulatory Visit (INDEPENDENT_AMBULATORY_CARE_PROVIDER_SITE_OTHER)

## 2024-03-16 DIAGNOSIS — E876 Hypokalemia: Secondary | ICD-10-CM | POA: Diagnosis not present

## 2024-03-16 LAB — BASIC METABOLIC PANEL WITH GFR
BUN: 14 mg/dL (ref 6–23)
CO2: 29 meq/L (ref 19–32)
Calcium: 9.4 mg/dL (ref 8.4–10.5)
Chloride: 100 meq/L (ref 96–112)
Creatinine, Ser: 0.7 mg/dL (ref 0.40–1.20)
GFR: 83.51 mL/min (ref >60.00)
Glucose, Bld: 88 mg/dL (ref 70–99)
Potassium: 3.8 meq/L (ref 3.5–5.1)
Sodium: 137 meq/L (ref 135–145)

## 2024-03-17 ENCOUNTER — Encounter: Payer: Self-pay | Admitting: Adult Health

## 2024-03-17 ENCOUNTER — Ambulatory Visit: Payer: Self-pay | Admitting: Adult Health

## 2024-03-23 ENCOUNTER — Ambulatory Visit: Admitting: Gastroenterology

## 2024-03-23 DIAGNOSIS — R911 Solitary pulmonary nodule: Secondary | ICD-10-CM | POA: Diagnosis not present

## 2024-03-23 DIAGNOSIS — M65911 Unspecified synovitis and tenosynovitis, right shoulder: Secondary | ICD-10-CM | POA: Diagnosis not present

## 2024-03-23 DIAGNOSIS — M19011 Primary osteoarthritis, right shoulder: Secondary | ICD-10-CM | POA: Diagnosis not present

## 2024-03-23 DIAGNOSIS — M7551 Bursitis of right shoulder: Secondary | ICD-10-CM | POA: Diagnosis not present

## 2024-03-23 DIAGNOSIS — M75121 Complete rotator cuff tear or rupture of right shoulder, not specified as traumatic: Secondary | ICD-10-CM | POA: Diagnosis not present

## 2024-03-23 DIAGNOSIS — M75111 Incomplete rotator cuff tear or rupture of right shoulder, not specified as traumatic: Secondary | ICD-10-CM | POA: Diagnosis not present

## 2024-03-26 DIAGNOSIS — F331 Major depressive disorder, recurrent, moderate: Secondary | ICD-10-CM | POA: Diagnosis not present

## 2024-03-26 DIAGNOSIS — Z8249 Family history of ischemic heart disease and other diseases of the circulatory system: Secondary | ICD-10-CM | POA: Diagnosis not present

## 2024-03-26 DIAGNOSIS — D329 Benign neoplasm of meninges, unspecified: Secondary | ICD-10-CM | POA: Diagnosis not present

## 2024-03-26 DIAGNOSIS — R32 Unspecified urinary incontinence: Secondary | ICD-10-CM | POA: Diagnosis not present

## 2024-03-26 DIAGNOSIS — E611 Iron deficiency: Secondary | ICD-10-CM | POA: Diagnosis not present

## 2024-03-26 DIAGNOSIS — Z23 Encounter for immunization: Secondary | ICD-10-CM | POA: Diagnosis not present

## 2024-03-26 DIAGNOSIS — I10 Essential (primary) hypertension: Secondary | ICD-10-CM | POA: Diagnosis not present

## 2024-03-26 DIAGNOSIS — R7303 Prediabetes: Secondary | ICD-10-CM | POA: Diagnosis not present

## 2024-03-26 DIAGNOSIS — D649 Anemia, unspecified: Secondary | ICD-10-CM | POA: Diagnosis not present

## 2024-04-08 DIAGNOSIS — M19011 Primary osteoarthritis, right shoulder: Secondary | ICD-10-CM | POA: Diagnosis not present

## 2024-05-05 DIAGNOSIS — S46121A Laceration of muscle, fascia and tendon of long head of biceps, right arm, initial encounter: Secondary | ICD-10-CM | POA: Diagnosis not present

## 2024-05-05 DIAGNOSIS — F32A Depression, unspecified: Secondary | ICD-10-CM | POA: Diagnosis not present

## 2024-05-05 DIAGNOSIS — K219 Gastro-esophageal reflux disease without esophagitis: Secondary | ICD-10-CM | POA: Diagnosis not present

## 2024-05-05 DIAGNOSIS — M19111 Post-traumatic osteoarthritis, right shoulder: Secondary | ICD-10-CM | POA: Diagnosis not present

## 2024-05-05 DIAGNOSIS — M19011 Primary osteoarthritis, right shoulder: Secondary | ICD-10-CM | POA: Diagnosis not present

## 2024-05-05 DIAGNOSIS — M25711 Osteophyte, right shoulder: Secondary | ICD-10-CM | POA: Diagnosis not present

## 2024-05-05 DIAGNOSIS — K449 Diaphragmatic hernia without obstruction or gangrene: Secondary | ICD-10-CM | POA: Diagnosis not present

## 2024-05-05 DIAGNOSIS — G8918 Other acute postprocedural pain: Secondary | ICD-10-CM | POA: Diagnosis not present

## 2024-05-05 DIAGNOSIS — I10 Essential (primary) hypertension: Secondary | ICD-10-CM | POA: Diagnosis not present

## 2024-05-05 DIAGNOSIS — S46111A Strain of muscle, fascia and tendon of long head of biceps, right arm, initial encounter: Secondary | ICD-10-CM | POA: Diagnosis not present

## 2024-05-05 DIAGNOSIS — M75101 Unspecified rotator cuff tear or rupture of right shoulder, not specified as traumatic: Secondary | ICD-10-CM | POA: Diagnosis not present

## 2024-05-19 ENCOUNTER — Ambulatory Visit: Admitting: Gastroenterology

## 2024-05-19 ENCOUNTER — Telehealth: Payer: Self-pay | Admitting: Gastroenterology

## 2024-05-19 NOTE — Telephone Encounter (Signed)
 Euna I am just seeing this message now, after my clinic has finished.   I would have been able to do a video visit for her this morning but I did not see the message until now. In the future please notify the front desk or nursing staff to let them know, or call me directly, as unfortunately we missed the window to accommodate her and I would have been able to do it had I seen this sooner. She will need to be rescheduled.

## 2024-05-19 NOTE — Progress Notes (Deleted)
 HPI :  78 y/o female here for a follow up visit for GERD and hiatal hernia.   Recall she had an EGD with me for reflux and dysphagia in November 2020.  She had a 5 cm hiatal hernia with distal esophageal ulcer/esophagitis associated with a peptic stricture that was dilated to 15.5 mm.  We started Protonix  40 mg daily at that time.  She followed up for repeat endoscopy in October 2022 which showed interval healing of esophagitis, slightly irregular Z-line, biopsies were negative for Barrett's.  She again had GEJ stenosis dilated with a balloon to 15.5 mm which resolved dysphagia at that time.  I have not seen her since then.   Generally has been doing really well since have last seen her.  She has had no dysphagia since the last dilation.  She reports she continues to take Protonix  40 mg once daily.  For the most part that works pretty well to control her reflux symptoms.  She will rarely have some breakthrough where she will need to use some liquid Carafate  which works quite well to control it.  On average she takes Carafate  perhaps once every 2 to 3 months, this is quite seldom.  She eats well, no abdominal pain, no nausea or vomiting.  She has no history of kidney disease or osteoporosis/pia, she inquired about long-term risks of the regimen and options moving forward.  Otherwise if she avoids her trigger foods her symptoms are quite minimal and she has been watching her diet very carefully.  She has not wanted to have a hiatal hernia repair.   Prior workup:  EGD 07/14/2019 -  - A 5 cm hiatal hernia was present. - One benign-appearing, intrinsic moderate stenosis was found 30 cm from the incisors. This stenosis measured less than one cm (in length). The stenosis was traversed. A TTS dilator was passed through the scope. Dilation with a 13.5-14.5-15.5 mm balloon dilator was performed to 13.5 mm, 14.5 mm and 15.5 mm after which multiple appropriate mucosal wrents were noted. There was slight  nodularity noted at the 6 o clock position at the GEJ which appeared benign / inflammatory. Biopsies were taken with a cold forceps for histology. - Esophagitis was found at the GEJ with ulceration. - The exam of the esophagus was otherwise normal. - The entire examined stomach was normal. - The duodenal bulb and second portion of the duodenum were normal.     Surgical [P], esophagus, GE junction - ESOPHAGEAL SQUAMOUS AND CARDIAC MUCOSA WITH NO SPECIFIC HISTOPATHOLOGIC CHANGES - NEGATIVE FOR INTESTINAL METAPLASIA OR DYSPLASIA       Colonoscopy 05/24/2015 - normal - no polyps       EGD 06/06/21: - Esophagogastric landmarks were identified: the Z-line was found at 30 cm, the gastroesophageal junction was found at 31 cm and the upper extent of the gastric folds was found at 36 cm from the incisors. Findings: - A 5 cm hiatal hernia was present. - The Z-line was irregular with extension of salmon colored mucosa extending up about 1cm or so from the GEJ. Biopsies were taken with a cold forceps for histology. There was interval healing of esophagitis on protonix . - One benign-appearing, intrinsic mild stenosis was found 31 cm from the incisors. This stenosis measured less than one cm (in length). A TTS dilator was passed through the scope. Dilation with a 13.5-14.5-15.5 mm balloon dilator was performed to 15.5 mm. - A diverticulum with a small opening was found at the gastroesophageal junction. -  The exam of the esophagus was otherwise normal. - The entire examined stomach was normal. - The duodenal bulb and second portion of the duodenum were normal.     Surgical [P], GE junction - REACTIVE SQUAMOCOLUMNAR JUNCTION WITH CHRONIC INFLAMMATION - NO INTESTINAL METAPLASIA, DYSPLASIA OR MALIGNANCY IDENTIFIED    78 y.o. female here for assessment of the following   1. Gastroesophageal reflux disease, unspecified whether esophagitis present   2. Hiatal hernia   3. Long-term current use of proton pump  inhibitor therapy     Moderate size hiatal hernia associated with esophagitis in the past and GEJ stricturing.  On PPI and status post dilation, she has not had any recurrent dysphagia, has had interval healing of esophagitis without Barrett's esophagus.  We discussed that if she is not on PPI suspect she will have recurrent symptoms in light of her hiatal hernia.  In regards to management of that, she would prefer not to have a surgery to repair the hernia, but understands she will likely need indefinite medical therapy for this.  We discussed long-term risks of chronic PPI use to include CKD, increased risk for bone fracture, C. difficile, etc.  Long-term want to use the lowest dose needed to control her symptoms.  After discussion of options, we will try decreasing her to 20 mg daily and see how she does.  If she needs to use Carafate  more frequently or having frequent breakthrough then she can increase back to 40 mg daily of Protonix  if needed.  She agrees with the plan, can follow-up with me yearly moving forward if not sooner with any issues.     PLAN: - discussed long term risks of chronic PPI use - she wishes to hold off on hiatal hernia repair - continue protonix , will try dose decrease to 20mg  / day. Cut pills in half and if does not tolerate it will go back to 40mg  - continue carafate  PRN - f/u yearly if not sooner with any symptoms or issues      Past Medical History:  Diagnosis Date   Allergy    seasonal   Anemia    Anxiety    Blood transfusion without reported diagnosis    years ago   Cystocele, midline    Degenerative cervical disc    Depression    GERD (gastroesophageal reflux disease)    Headache(784.0)    Hypertension    IDA (iron deficiency anemia)    Osteoarthritis of right shoulder      Past Surgical History:  Procedure Laterality Date   COLONOSCOPY     EYE SURGERY     TUBAL LIGATION     Family History  Problem Relation Age of Onset   Heart disease  Mother    Alcohol abuse Other    Anxiety disorder Other    Depression Other    Hyperlipidemia Other    Hypertension Other    Colon cancer Neg Hx    Esophageal cancer Neg Hx    Stomach cancer Neg Hx    Rectal cancer Neg Hx    Breast cancer Neg Hx    BRCA 1/2 Neg Hx    Social History   Tobacco Use   Smoking status: Never   Smokeless tobacco: Never  Vaping Use   Vaping status: Never Used  Substance Use Topics   Alcohol use: No   Drug use: No   Current Outpatient Medications  Medication Sig Dispense Refill   aspirin 81 MG tablet Take 81 mg by  mouth daily.     fish oil-omega-3 fatty acids 1000 MG capsule Take 1 g by mouth daily.     Multiple Vitamins-Minerals (PRESERVISION AREDS 2 PO) Take by mouth.     pantoprazole  (PROTONIX ) 20 MG tablet Take 1-2 tablets (20-40 mg total) by mouth daily. 60 tablet 5   rosuvastatin  (CRESTOR ) 20 MG tablet Take 20 mg by mouth once a week.     sertraline  (ZOLOFT ) 50 MG tablet TAKE 1 TABLET BY MOUTH EVERY DAY 90 tablet 1   sucralfate  (CARAFATE ) 1 GM/10ML suspension Take 10 mLs (1 g total) by mouth every 6 (six) hours as needed. 240 mL 3   triamterene -hydrochlorothiazide  (DYAZIDE) 37.5-25 MG capsule Take 1 each (1 capsule total) by mouth daily. 90 capsule 1   No current facility-administered medications for this visit.   Allergies  Allergen Reactions   Misc. Sulfonamide Containing Compounds      Review of Systems: All systems reviewed and negative except where noted in HPI.    No results found.  Physical Exam: There were no vitals taken for this visit. Constitutional: Pleasant,well-developed, ***female in no acute distress. HEENT: Normocephalic and atraumatic. Conjunctivae are normal. No scleral icterus. Neck supple.  Cardiovascular: Normal rate, regular rhythm.  Pulmonary/chest: Effort normal and breath sounds normal. No wheezing, rales or rhonchi. Abdominal: Soft, nondistended, nontender. Bowel sounds active throughout. There are no  masses palpable. No hepatomegaly. Extremities: no edema Lymphadenopathy: No cervical adenopathy noted. Neurological: Alert and oriented to person place and time. Skin: Skin is warm and dry. No rashes noted. Psychiatric: Normal mood and affect. Behavior is normal.   ASSESSMENT: 78 y.o. female here for assessment of the following  No diagnosis found.  PLAN:   Merna Huxley, NP

## 2024-05-19 NOTE — Telephone Encounter (Signed)
 Good Morning Dr Leigh   Inbound call from patient states she is unable to make it to [place] in time for her office visit. Requesting video visit instead? Please advise. Thank you

## 2024-05-19 NOTE — Telephone Encounter (Signed)
 Good Afternoon Dr Leigh   I do apologize. However I did contact the CMA by phone as well as secure chat to try to address the concern in a timely manner but was unsuccessful. I will call the patient and have her rescheduled.   Thank you

## 2024-05-19 NOTE — Telephone Encounter (Signed)
 Okay I understand. Thanks

## 2024-05-20 DIAGNOSIS — Z96611 Presence of right artificial shoulder joint: Secondary | ICD-10-CM | POA: Diagnosis not present

## 2024-05-20 DIAGNOSIS — Z471 Aftercare following joint replacement surgery: Secondary | ICD-10-CM | POA: Diagnosis not present

## 2024-05-20 DIAGNOSIS — M19011 Primary osteoarthritis, right shoulder: Secondary | ICD-10-CM | POA: Diagnosis not present

## 2024-05-28 DIAGNOSIS — M19011 Primary osteoarthritis, right shoulder: Secondary | ICD-10-CM | POA: Diagnosis not present

## 2024-06-03 DIAGNOSIS — Z4689 Encounter for fitting and adjustment of other specified devices: Secondary | ICD-10-CM | POA: Diagnosis not present

## 2024-06-03 DIAGNOSIS — N8111 Cystocele, midline: Secondary | ICD-10-CM | POA: Diagnosis not present

## 2024-06-03 DIAGNOSIS — N952 Postmenopausal atrophic vaginitis: Secondary | ICD-10-CM | POA: Diagnosis not present

## 2024-06-03 DIAGNOSIS — N951 Menopausal and female climacteric states: Secondary | ICD-10-CM | POA: Diagnosis not present

## 2024-06-04 DIAGNOSIS — M19011 Primary osteoarthritis, right shoulder: Secondary | ICD-10-CM | POA: Diagnosis not present

## 2024-06-09 DIAGNOSIS — M19011 Primary osteoarthritis, right shoulder: Secondary | ICD-10-CM | POA: Diagnosis not present

## 2024-06-10 ENCOUNTER — Other Ambulatory Visit: Payer: Self-pay | Admitting: Adult Health

## 2024-06-10 DIAGNOSIS — F329 Major depressive disorder, single episode, unspecified: Secondary | ICD-10-CM

## 2024-06-12 DIAGNOSIS — M19011 Primary osteoarthritis, right shoulder: Secondary | ICD-10-CM | POA: Diagnosis not present

## 2024-06-17 DIAGNOSIS — M19011 Primary osteoarthritis, right shoulder: Secondary | ICD-10-CM | POA: Diagnosis not present

## 2024-06-19 DIAGNOSIS — M19011 Primary osteoarthritis, right shoulder: Secondary | ICD-10-CM | POA: Diagnosis not present

## 2024-06-22 DIAGNOSIS — M19011 Primary osteoarthritis, right shoulder: Secondary | ICD-10-CM | POA: Diagnosis not present

## 2024-06-24 DIAGNOSIS — M19011 Primary osteoarthritis, right shoulder: Secondary | ICD-10-CM | POA: Diagnosis not present

## 2024-06-25 DIAGNOSIS — L814 Other melanin hyperpigmentation: Secondary | ICD-10-CM | POA: Diagnosis not present

## 2024-06-25 DIAGNOSIS — D225 Melanocytic nevi of trunk: Secondary | ICD-10-CM | POA: Diagnosis not present

## 2024-06-25 DIAGNOSIS — D2272 Melanocytic nevi of left lower limb, including hip: Secondary | ICD-10-CM | POA: Diagnosis not present

## 2024-06-25 DIAGNOSIS — Z85828 Personal history of other malignant neoplasm of skin: Secondary | ICD-10-CM | POA: Diagnosis not present

## 2024-06-25 DIAGNOSIS — D2261 Melanocytic nevi of right upper limb, including shoulder: Secondary | ICD-10-CM | POA: Diagnosis not present

## 2024-06-25 DIAGNOSIS — L821 Other seborrheic keratosis: Secondary | ICD-10-CM | POA: Diagnosis not present

## 2024-06-25 DIAGNOSIS — D2262 Melanocytic nevi of left upper limb, including shoulder: Secondary | ICD-10-CM | POA: Diagnosis not present

## 2024-06-25 DIAGNOSIS — D2271 Melanocytic nevi of right lower limb, including hip: Secondary | ICD-10-CM | POA: Diagnosis not present

## 2024-06-26 DIAGNOSIS — Z96611 Presence of right artificial shoulder joint: Secondary | ICD-10-CM | POA: Diagnosis not present

## 2024-06-26 DIAGNOSIS — M19011 Primary osteoarthritis, right shoulder: Secondary | ICD-10-CM | POA: Diagnosis not present

## 2024-06-30 DIAGNOSIS — M19011 Primary osteoarthritis, right shoulder: Secondary | ICD-10-CM | POA: Diagnosis not present

## 2024-07-02 DIAGNOSIS — M19011 Primary osteoarthritis, right shoulder: Secondary | ICD-10-CM | POA: Diagnosis not present

## 2024-07-06 DIAGNOSIS — M19011 Primary osteoarthritis, right shoulder: Secondary | ICD-10-CM | POA: Diagnosis not present

## 2024-07-08 DIAGNOSIS — M19011 Primary osteoarthritis, right shoulder: Secondary | ICD-10-CM | POA: Diagnosis not present

## 2024-07-09 ENCOUNTER — Encounter: Payer: Self-pay | Admitting: Gastroenterology

## 2024-07-09 ENCOUNTER — Ambulatory Visit: Admitting: Gastroenterology

## 2024-07-09 VITALS — BP 120/70 | HR 89 | Ht 61.0 in | Wt 118.1 lb

## 2024-07-09 DIAGNOSIS — K449 Diaphragmatic hernia without obstruction or gangrene: Secondary | ICD-10-CM

## 2024-07-09 DIAGNOSIS — Z79899 Other long term (current) drug therapy: Secondary | ICD-10-CM

## 2024-07-09 DIAGNOSIS — K219 Gastro-esophageal reflux disease without esophagitis: Secondary | ICD-10-CM

## 2024-07-09 DIAGNOSIS — K222 Esophageal obstruction: Secondary | ICD-10-CM

## 2024-07-09 MED ORDER — PANTOPRAZOLE SODIUM 20 MG PO TBEC: 20.0000 mg | DELAYED_RELEASE_TABLET | Freq: Every day | ORAL | 11 refills | Status: AC

## 2024-07-09 NOTE — Progress Notes (Signed)
 HPI :  78 year old female here for follow-up visit for GERD chronic chronic PPI use, dysphagia, hiatal hernia.  Recall she has had reflux for years, she previously has had EGDs with me showing a peptic stricture at the GEJ that was dilated in 2020 and again most recently in 2022.  No history of Barrett's esophagus.  She does have a diverticulum at the GEJ as well.  At her last visit she was taking Protonix  40 mg once daily that was working pretty well to control her symptoms.  We discussed long-term risks of chronic PPI use, we tried to wean her to a lower dose of Protonix  as tolerated.  At the last visit we switched her to 20 mg of Protonix  daily.  She states in general if she watches what she eats, this will work to control her symptoms pretty well.  She does not have much breakthrough.  She has to avoid greasy foods but if she does that she states it is well-controlled.  She has had dysphagia in the past that has been treated with dilation.  She says she has an occasional episode that is very mild and rare, and she is not interested in pursuing EGD with dilation at this time but will be mindful of this and if worsening she would want to proceed with that.  She has a DEXA scan pending to assess for osteoporosis.  Recall her last colonoscopy was in September 2016 and it was normal without polyps.  She states she has never had any polyps historically.  She has no bowel symptoms, states she is not interested in pursuing further colonoscopy.  She would be due next year for an exam should she want to consider that.    Prior workup:  EGD 07/14/2019 -  - A 5 cm hiatal hernia was present. - One benign-appearing, intrinsic moderate stenosis was found 30 cm from the incisors. This stenosis measured less than one cm (in length). The stenosis was traversed. A TTS dilator was passed through the scope. Dilation with a 13.5-14.5-15.5 mm balloon dilator was performed to 13.5 mm, 14.5 mm and 15.5 mm after which  multiple appropriate mucosal wrents were noted. There was slight nodularity noted at the 6 o clock position at the GEJ which appeared benign / inflammatory. Biopsies were taken with a cold forceps for histology. - Esophagitis was found at the GEJ with ulceration. - The exam of the esophagus was otherwise normal. - The entire examined stomach was normal. - The duodenal bulb and second portion of the duodenum were normal.     Surgical [P], esophagus, GE junction - ESOPHAGEAL SQUAMOUS AND CARDIAC MUCOSA WITH NO SPECIFIC HISTOPATHOLOGIC CHANGES - NEGATIVE FOR INTESTINAL METAPLASIA OR DYSPLASIA     Colonoscopy 05/24/2015 - normal - no polyps    EGD 06/06/21: - Esophagogastric landmarks were identified: the Z-line was found at 30 cm, the gastroesophageal junction was found at 31 cm and the upper extent of the gastric folds was found at 36 cm from the incisors. Findings: - A 5 cm hiatal hernia was present. - The Z-line was irregular with extension of salmon colored mucosa extending up about 1cm or so from the GEJ. Biopsies were taken with a cold forceps for histology. There was interval healing of esophagitis on protonix . - One benign-appearing, intrinsic mild stenosis was found 31 cm from the incisors. This stenosis measured less than one cm (in length). A TTS dilator was passed through the scope. Dilation with a 13.5-14.5-15.5 mm balloon dilator  was performed to 15.5 mm. - A diverticulum with a small opening was found at the gastroesophageal junction. - The exam of the esophagus was otherwise normal. - The entire examined stomach was normal. - The duodenal bulb and second portion of the duodenum were normal.     Surgical [P], GE junction - REACTIVE SQUAMOCOLUMNAR JUNCTION WITH CHRONIC INFLAMMATION - NO INTESTINAL METAPLASIA, DYSPLASIA OR MALIGNANCY IDENTIFIED   Past Medical History:  Diagnosis Date   Allergy    seasonal   Anemia    Anxiety    Blood transfusion without reported diagnosis     years ago   Cystocele, midline    Degenerative cervical disc    Depression    GERD (gastroesophageal reflux disease)    Headache(784.0)    Hypertension    IDA (iron deficiency anemia)    Osteoarthritis of right shoulder      Past Surgical History:  Procedure Laterality Date   COLONOSCOPY     EYE SURGERY     TOTAL SHOULDER REPLACEMENT  2025   TUBAL LIGATION     Family History  Problem Relation Age of Onset   Heart disease Mother    Alcohol abuse Other    Anxiety disorder Other    Depression Other    Hyperlipidemia Other    Hypertension Other    Colon cancer Neg Hx    Esophageal cancer Neg Hx    Stomach cancer Neg Hx    Rectal cancer Neg Hx    Breast cancer Neg Hx    BRCA 1/2 Neg Hx    Social History   Tobacco Use   Smoking status: Never   Smokeless tobacco: Never  Vaping Use   Vaping status: Never Used  Substance Use Topics   Alcohol use: No   Drug use: No   Current Outpatient Medications  Medication Sig Dispense Refill   aspirin 81 MG tablet Take 81 mg by mouth daily.     estradiol (ESTRACE) 0.01 % CREA vaginal cream Insert 1 g twice a week by vaginal route as directed for 30 days.     fish oil-omega-3 fatty acids 1000 MG capsule Take 1 g by mouth daily.     Multiple Vitamins-Minerals (PRESERVISION AREDS 2 PO) Take by mouth.     pantoprazole  (PROTONIX ) 20 MG tablet Take 1-2 tablets (20-40 mg total) by mouth daily. 60 tablet 5   sertraline  (ZOLOFT ) 50 MG tablet TAKE 1 TABLET BY MOUTH EVERY DAY 90 tablet 1   sucralfate  (CARAFATE ) 1 GM/10ML suspension Take 10 mLs (1 g total) by mouth every 6 (six) hours as needed. 240 mL 3   triamterene -hydrochlorothiazide  (DYAZIDE) 37.5-25 MG capsule Take 1 each (1 capsule total) by mouth daily. 90 capsule 1   No current facility-administered medications for this visit.   Allergies  Allergen Reactions   Misc. Sulfonamide Containing Compounds      Review of Systems: All systems reviewed and negative except where noted in  HPI.    Lab Results  Component Value Date   NA 137 03/16/2024   CL 100 03/16/2024   K 3.8 03/16/2024   CO2 29 03/16/2024   BUN 14 03/16/2024   CREATININE 0.70 03/16/2024   GFR 83.51 03/16/2024   CALCIUM  9.4 03/16/2024   ALBUMIN 4.2 03/12/2024   GLUCOSE 88 03/16/2024    Lab Results  Component Value Date   ALT 15 03/12/2024   AST 23 03/12/2024   ALKPHOS 92 03/12/2024   BILITOT 0.2 03/12/2024  Physical Exam: BP 120/70   Pulse 89   Ht 5' 1 (1.549 m)   Wt 118 lb 2 oz (53.6 kg)   BMI 22.32 kg/m  Constitutional: Pleasant,well-developed, female in no acute distress. Neurological: Alert and oriented to person place and time. Psychiatric: Normal mood and affect. Behavior is normal.   ASSESSMENT: 78 y.o. female here for assessment of the following  1. Gastroesophageal reflux disease, unspecified whether esophagitis present   2. Hiatal hernia   3. Esophageal stricture   4. Long-term current use of proton pump inhibitor therapy    Overall doing well.  Over the past year we have been able to reduce her dose of PPI from 40 mg of pantoprazole  to 20 mg daily.  She reports similar control of symptoms and doing pretty well in this regard.  We have discussed long-term risks of chronic PPI use.  She understands this.  She has been screened for osteoporosis in the near future with a DEXA scan.  She does have a pretty good size hiatal hernia, likely will have longstanding symptoms that require medication if she does not have the hernia repaired.  She is not interested in hiatal hernia repair.  She prefers to continue chronic PPI at lowest dose needed to control symptoms.  Of note she does have a history of an esophageal stricture, has required dilation in the past.  She does have very rare mild dysphagia but not bothersome.  If this progresses or worsens she would be interested in EGD but wants to hold off for now.  Follow-up yearly if not sooner with any issues.  PLAN: - continue  protonix  20mg  / day for now, refilled - discussed risks / benefits of chronic PPI use, I think benefits of chronic PPI > risks, she does not want HH repair - if dysphagia recurs let me know, very mild / intermittent at this time, declines EGD - DEXA pending - f/u yearly if not sooner with any issues  Marcey Naval, MD [facility]

## 2024-07-09 NOTE — Patient Instructions (Signed)
 We have sent the following medications to your pharmacy for you to pick up at your convenience: Protonix    Thank you for entrusting me with your care and for choosing [facility], Dr. Elspeth Naval   _______________________________________________________  If your blood pressure at your visit was 140/90 or greater, please contact your primary care physician to follow up on this.  _______________________________________________________  If you are age 19 or older, your body mass index should be between 23-30. Your Body mass index is 22.32 kg/m. If this is out of the aforementioned range listed, please consider follow up with your Primary Care Provider.  If you are age 26 or younger, your body mass index should be between 19-25. Your Body mass index is 22.32 kg/m. If this is out of the aformentioned range listed, please consider follow up with your Primary Care Provider.   ________________________________________________________  The [facility] providers would like to encourage you to use MYCHART to communicate with providers for non-urgent requests or questions.  Due to long hold times on the telephone, sending your provider a message by [facility] may be a faster and more efficient way to get a response.  Please allow 48 business hours for a response.  Please remember that this is for non-urgent requests.  _______________________________________________________  [facility] is using a team-based approach to care.  Your team is made up of your doctor and two to three APPS. Our APPS (Nurse Practitioners and Physician Assistants) work with your physician to ensure care continuity for you. They are fully qualified to address your health concerns and develop a treatment plan. They communicate directly with your gastroenterologist to care for you. Seeing the Advanced Practice Practitioners on your physician's team can help you by facilitating care more promptly, often allowing  for earlier appointments, access to diagnostic testing, procedures, and other specialty referrals.

## 2024-07-15 DIAGNOSIS — M19011 Primary osteoarthritis, right shoulder: Secondary | ICD-10-CM | POA: Diagnosis not present

## 2024-07-17 DIAGNOSIS — M19011 Primary osteoarthritis, right shoulder: Secondary | ICD-10-CM | POA: Diagnosis not present

## 2024-07-22 DIAGNOSIS — M19011 Primary osteoarthritis, right shoulder: Secondary | ICD-10-CM | POA: Diagnosis not present

## 2024-07-24 DIAGNOSIS — M19011 Primary osteoarthritis, right shoulder: Secondary | ICD-10-CM | POA: Diagnosis not present

## 2024-07-27 DIAGNOSIS — M19011 Primary osteoarthritis, right shoulder: Secondary | ICD-10-CM | POA: Diagnosis not present

## 2024-07-28 DIAGNOSIS — M8580 Other specified disorders of bone density and structure, unspecified site: Secondary | ICD-10-CM | POA: Diagnosis not present

## 2024-08-05 DIAGNOSIS — M19011 Primary osteoarthritis, right shoulder: Secondary | ICD-10-CM | POA: Diagnosis not present

## 2024-08-06 ENCOUNTER — Encounter: Payer: Medicare PPO | Admitting: Adult Health

## 2024-08-07 DIAGNOSIS — M19011 Primary osteoarthritis, right shoulder: Secondary | ICD-10-CM | POA: Diagnosis not present

## 2024-09-11 ENCOUNTER — Other Ambulatory Visit: Payer: Self-pay | Admitting: Adult Health

## 2024-09-11 DIAGNOSIS — I10 Essential (primary) hypertension: Secondary | ICD-10-CM

## 2024-09-11 NOTE — Telephone Encounter (Signed)
 Does pt still need a lab f/u from July labs?

## 2024-10-27 ENCOUNTER — Ambulatory Visit: Admitting: Physician Assistant
